# Patient Record
Sex: Female | Born: 1995 | Race: White | Hispanic: No | Marital: Single | Smoking: Never smoker
Health system: Southern US, Community
[De-identification: ages and names within clinical notes are randomized; demographics above are authoritative.]

## PROBLEM LIST (undated history)

## (undated) ENCOUNTER — Inpatient Hospital Stay (HOSPITAL_COMMUNITY): Payer: Self-pay

## (undated) DIAGNOSIS — Z789 Other specified health status: Secondary | ICD-10-CM

---
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
## 2016-09-11 LAB — OB RESULTS CONSOLE HEPATITIS B SURFACE ANTIGEN: HEP B S AG: NEGATIVE

## 2016-09-11 LAB — OB RESULTS CONSOLE RPR: RPR: NONREACTIVE

## 2016-09-11 LAB — OB RESULTS CONSOLE GC/CHLAMYDIA
CHLAMYDIA, DNA PROBE: NEGATIVE
Gonorrhea: NEGATIVE

## 2016-09-11 LAB — OB RESULTS CONSOLE RUBELLA ANTIBODY, IGM: RUBELLA: IMMUNE

## 2016-09-11 LAB — OB RESULTS CONSOLE GBS: GBS: NEGATIVE

## 2016-09-11 LAB — OB RESULTS CONSOLE HIV ANTIBODY (ROUTINE TESTING): HIV: NONREACTIVE

## 2016-11-21 ENCOUNTER — Inpatient Hospital Stay (HOSPITAL_COMMUNITY): Payer: 59

## 2016-11-21 ENCOUNTER — Inpatient Hospital Stay (HOSPITAL_COMMUNITY)
Admission: AD | Admit: 2016-11-21 | Discharge: 2016-11-21 | Disposition: A | Discharge status: Home / Self Care | Payer: 59 | Source: Ambulatory Visit | Attending: Obstetrics & Gynecology | Admitting: Obstetrics & Gynecology

## 2016-11-21 ENCOUNTER — Encounter (HOSPITAL_COMMUNITY): Payer: Self-pay | Admitting: *Deleted

## 2016-11-21 DIAGNOSIS — O4693 Antepartum hemorrhage, unspecified, third trimester: Secondary | ICD-10-CM | POA: Insufficient documentation

## 2016-11-21 DIAGNOSIS — O368130 Decreased fetal movements, third trimester, not applicable or unspecified: Secondary | ICD-10-CM | POA: Insufficient documentation

## 2016-11-21 DIAGNOSIS — O468X3 Other antepartum hemorrhage, third trimester: Secondary | ICD-10-CM

## 2016-11-21 DIAGNOSIS — Z3A31 31 weeks gestation of pregnancy: Secondary | ICD-10-CM | POA: Insufficient documentation

## 2016-11-21 DIAGNOSIS — N930 Postcoital and contact bleeding: Secondary | ICD-10-CM

## 2016-11-21 DIAGNOSIS — O133 Gestational [pregnancy-induced] hypertension without significant proteinuria, third trimester: Secondary | ICD-10-CM

## 2016-11-21 HISTORY — DX: Other specified health status: Z78.9

## 2016-11-21 LAB — CBC
HEMATOCRIT: 33.9 % — AB (ref 36.0–46.0)
HEMOGLOBIN: 11.7 g/dL — AB (ref 12.0–15.0)
MCH: 30.1 pg (ref 26.0–34.0)
MCHC: 34.5 g/dL (ref 30.0–36.0)
MCV: 87.1 fL (ref 78.0–100.0)
Platelets: 243 10*3/uL (ref 150–400)
RBC: 3.89 MIL/uL (ref 3.87–5.11)
RDW: 12.8 % (ref 11.5–15.5)
WBC: 10.7 10*3/uL — AB (ref 4.0–10.5)

## 2016-11-21 LAB — URINALYSIS, ROUTINE W REFLEX MICROSCOPIC
Bilirubin Urine: NEGATIVE
GLUCOSE, UA: NEGATIVE mg/dL
Ketones, ur: NEGATIVE mg/dL
Nitrite: NEGATIVE
PH: 6 (ref 5.0–8.0)
PROTEIN: NEGATIVE mg/dL
SPECIFIC GRAVITY, URINE: 1.003 — AB (ref 1.005–1.030)

## 2016-11-21 LAB — COMPREHENSIVE METABOLIC PANEL
ALBUMIN: 3 g/dL — AB (ref 3.5–5.0)
ALT: 55 U/L — AB (ref 14–54)
AST: 36 U/L (ref 15–41)
Alkaline Phosphatase: 59 U/L (ref 38–126)
Anion gap: 8 (ref 5–15)
BILIRUBIN TOTAL: 0.6 mg/dL (ref 0.3–1.2)
BUN: 7 mg/dL (ref 6–20)
CHLORIDE: 108 mmol/L (ref 101–111)
CO2: 20 mmol/L — ABNORMAL LOW (ref 22–32)
CREATININE: 0.54 mg/dL (ref 0.44–1.00)
Calcium: 8.9 mg/dL (ref 8.9–10.3)
GFR calc Af Amer: >60 mL/min (ref >60)
GFR calc non Af Amer: >60 mL/min (ref >60)
GLUCOSE: 88 mg/dL (ref 65–99)
POTASSIUM: 3.6 mmol/L (ref 3.5–5.1)
Sodium: 136 mmol/L (ref 135–145)
TOTAL PROTEIN: 6.7 g/dL (ref 6.5–8.1)

## 2016-11-21 LAB — PROTEIN / CREATININE RATIO, URINE
Creatinine, Urine: 27 mg/dL
Total Protein, Urine: <6 mg/dL

## 2016-11-21 IMAGING — US US MFM FETAL BPP W/O NON-STRESS
1 series · 10 of 10 positions shown · non-contrast
Comparison: none

[Series 1: us mfm fetal bpp w/o non-stress · 10 acquisitions, 10 frames shown]
[im 1/10]
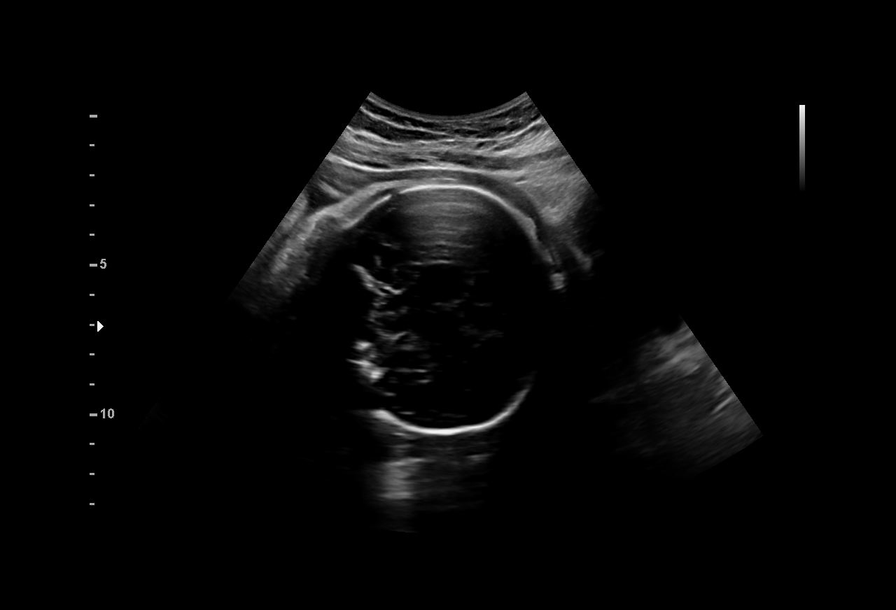
[im 2/10]
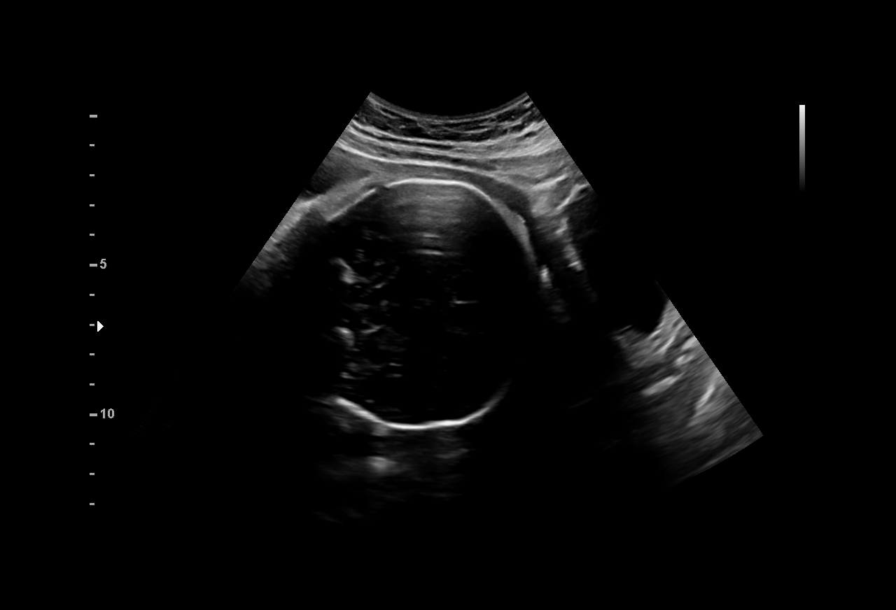
[im 3/10]
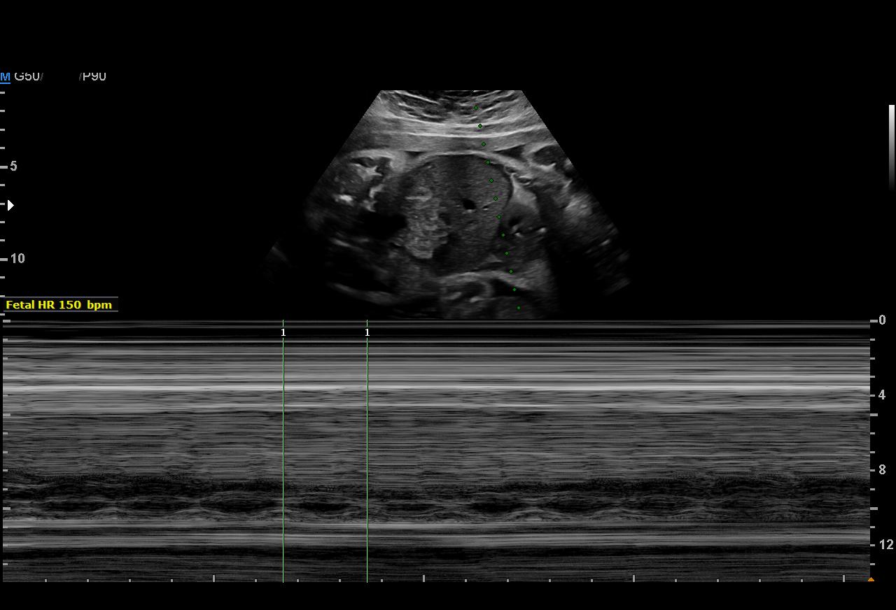
[im 4/10]
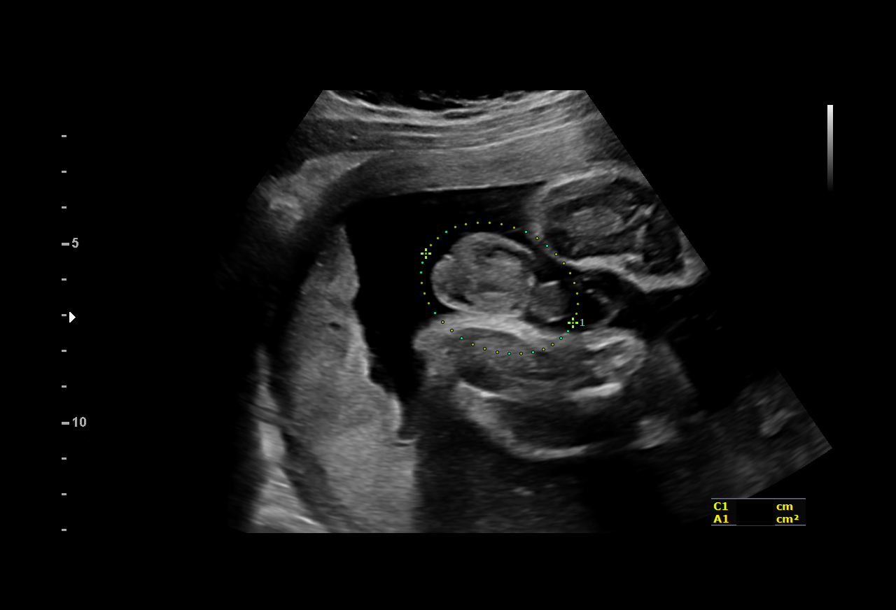
[im 5/10]
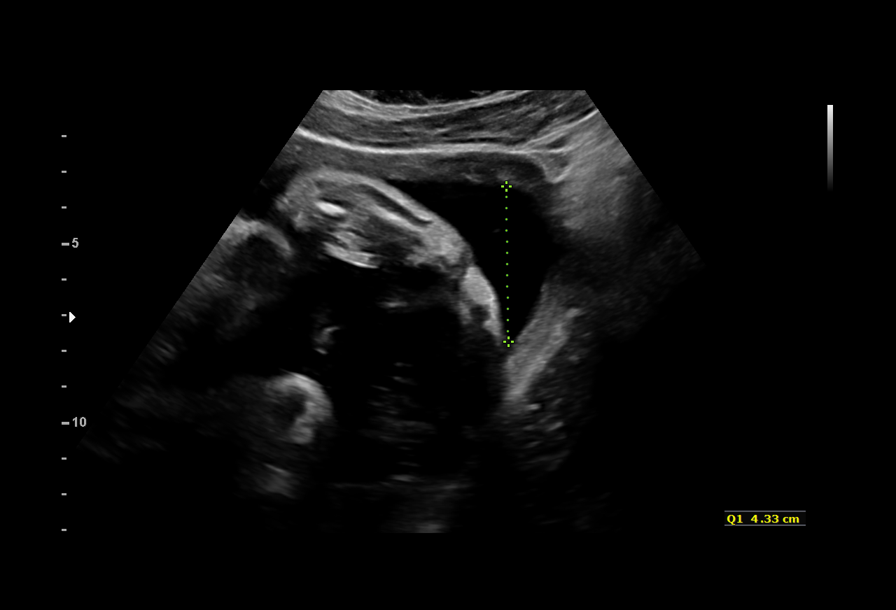
[im 6/10]
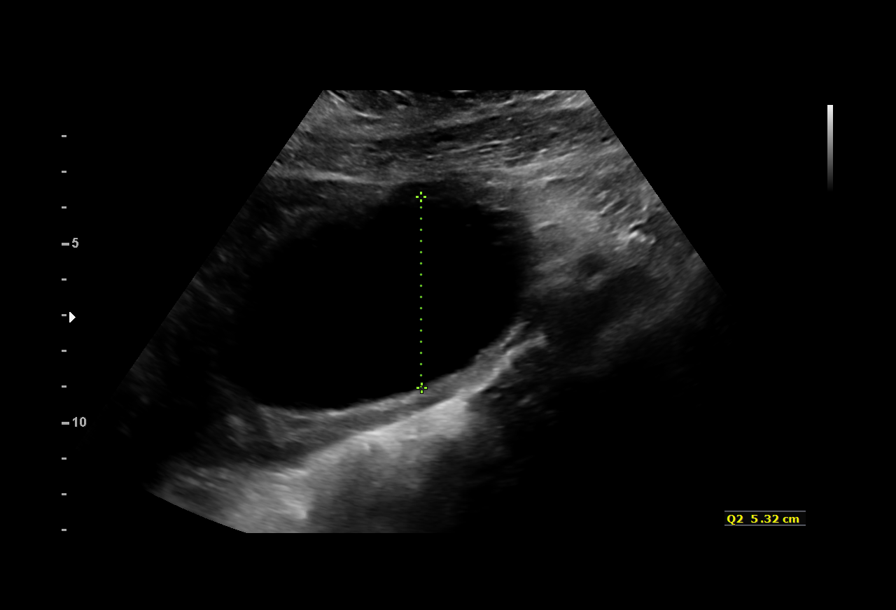
[im 7/10]
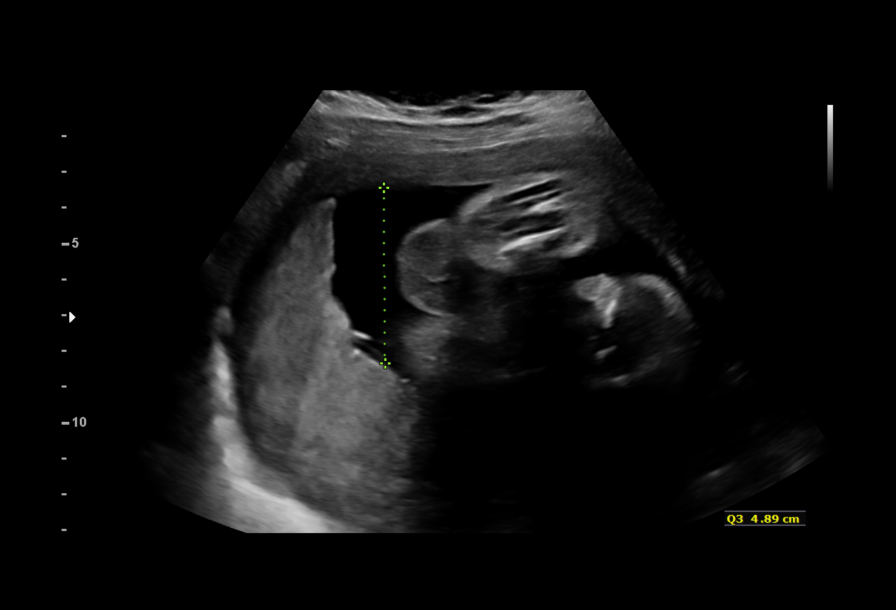
[im 8/10]
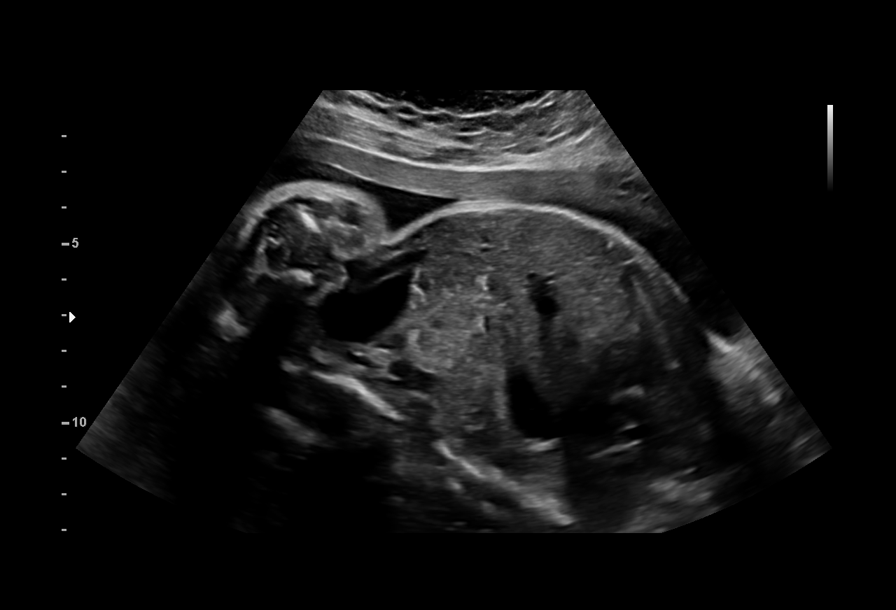
[im 9/10]
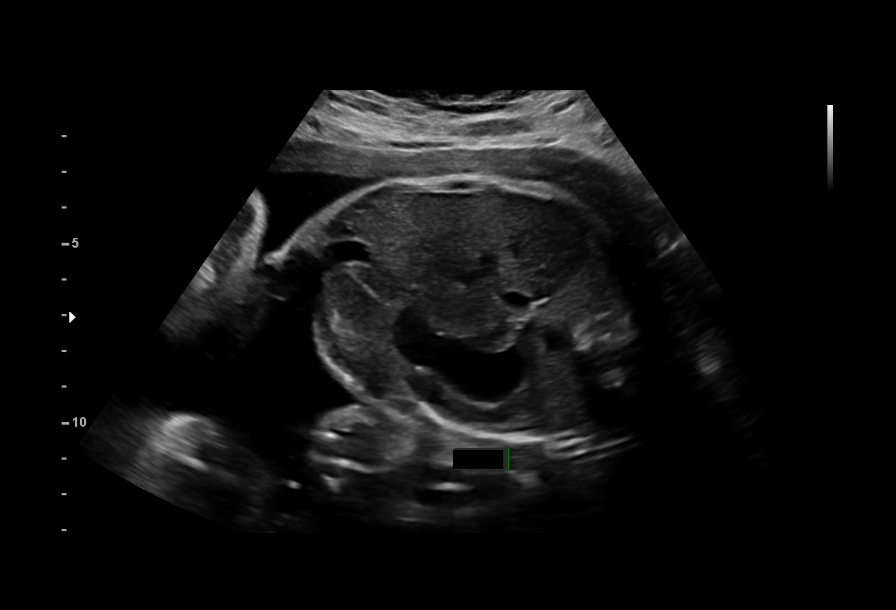
[im 10/10]
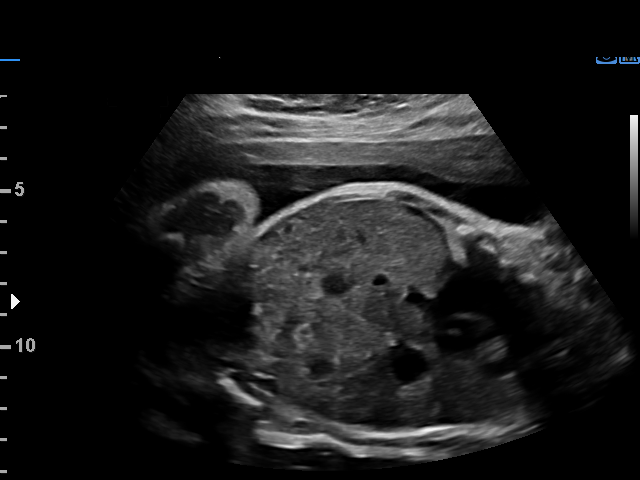

[10 of 10 positions shown; findings below may reference images not displayed]

Indications

31 weeks gestation of pregnancy
Decreased fetal movement                       [LZ]
Variable fetal heart rate decelerations,       [LZ]
antepartum
OB History

Gravidity:    1
Fetal Evaluation

Num Of Fetuses:     1
Fetal Heart         150
Rate(bpm):
Cardiac Activity:   Observed
Presentation:       Cephalic

Amniotic Fluid
AFI FV:      Subjectively within normal limits

AFI Sum(cm)     %Tile       Largest Pocket(cm)
14.54           51

RUQ(cm)                     LUQ(cm)        LLQ(cm)
4.33
Biophysical Evaluation

Amniotic F.V:   Within normal limits       F. Tone:        Observed
F. Movement:    Observed                   Score:          [DATE]
F. Breathing:   Observed
Gestational Age

Clinical EDD:  31w 1d                                        EDD:   [DATE]
Best:          31w 1d    Det. By:   Clinical EDD             EDD:   [DATE]
Anatomy

Stomach:               Appears normal, left   Bladder:                Appears normal
sided
Cervix Uterus Adnexa

Cervix
Not visualized (advanced GA >[LZ])
Impression

Singleton intrauterine pregnancy at 31 weeks 1 day gestation
with fetal cardiac activity
Cephalic presentation
AFI > 14 cm
Recommendations

Follow-up ultrasounds as clinically indicated.

## 2016-11-21 NOTE — Discharge Instructions (Signed)
Hypertension During Pregnancy °Hypertension, commonly called high blood pressure, is when the force of blood pumping through your arteries is too strong. Arteries are blood vessels that carry blood from the heart throughout the body. Hypertension during pregnancy can cause problems for you and your baby. Your baby may be born early (prematurely) or may not weigh as much as he or she should at birth. Very bad cases of hypertension during pregnancy can be life-threatening. °Different types of hypertension can occur during pregnancy. These include: °· Chronic hypertension. This happens when: °? You have hypertension before pregnancy and it continues during pregnancy. °? You develop hypertension before you are [redacted] weeks pregnant, and it continues during pregnancy. °· Gestational hypertension. This is hypertension that develops after the 20th week of pregnancy. °· Preeclampsia, also called toxemia of pregnancy. This is a very serious type of hypertension that develops only during pregnancy. It affects the whole body, and it can be very dangerous for you and your baby. ° °Gestational hypertension and preeclampsia usually go away within 6 weeks after your baby is born. Women who have hypertension during pregnancy have a greater chance of developing hypertension later in life or during future pregnancies. °What are the causes? °The exact cause of hypertension is not known. °What increases the risk? °There are certain factors that make it more likely for you to develop hypertension during pregnancy. These include: °· Having hypertension during a previous pregnancy or prior to pregnancy. °· Being overweight. °· Being older than age 40. °· Being pregnant for the first time or being pregnant with more than one baby. °· Becoming pregnant using fertilization methods such as IVF (in vitro fertilization). °· Having diabetes, kidney problems, or systemic lupus erythematosus. °· Having a family history of hypertension. ° °What are the  signs or symptoms? °Chronic hypertension and gestational hypertension rarely cause symptoms. Preeclampsia causes symptoms, which may include: °· Increased protein in your urine. Your health care provider will check for this at every visit before you give birth (prenatal visit). °· Severe headaches. °· Sudden weight gain. °· Swelling of the hands, face, legs, and feet. °· Nausea and vomiting. °· Vision problems, such as blurred or double vision. °· Numbness in the face, arms, legs, and feet. °· Dizziness. °· Slurred speech. °· Sensitivity to bright lights. °· Abdominal pain. °· Convulsions. ° °How is this diagnosed? °You may be diagnosed with hypertension during a routine prenatal exam. At each prenatal visit, you may: °· Have a urine test to check for high amounts of protein in your urine. °· Have your blood pressure checked. A blood pressure reading is recorded as two numbers, such as "120 over 80" (or 120/80). The first ("top") number is called the systolic pressure. It is a measure of the pressure in your arteries when your heart beats. The second ("bottom") number is called the diastolic pressure. It is a measure of the pressure in your arteries as your heart relaxes between beats. Blood pressure is measured in a unit called mm Hg. A normal blood pressure reading is: °? Systolic: below 120. °? Diastolic: below 80. ° °The type of hypertension that you are diagnosed with depends on your test results and when your symptoms developed. °· Chronic hypertension is usually diagnosed before 20 weeks of pregnancy. °· Gestational hypertension is usually diagnosed after 20 weeks of pregnancy. °· Hypertension with high amounts of protein in the urine is diagnosed as preeclampsia. °· Blood pressure measurements that stay above 160 systolic, or above 110 diastolic, are   signs of severe preeclampsia. ° °How is this treated? °Treatment for hypertension during pregnancy varies depending on the type of hypertension you have and how  serious it is. °· If you take medicines called ACE inhibitors to treat chronic hypertension, you may need to switch medicines. ACE inhibitors should not be taken during pregnancy. °· If you have gestational hypertension, you may need to take blood pressure medicine. °· If you are at risk for preeclampsia, your health care provider may recommend that you take a low-dose aspirin every day to prevent high blood pressure during your pregnancy. °· If you have severe preeclampsia, you may need to be hospitalized so you and your baby can be monitored closely. You may also need to take medicine (magnesium sulfate) to prevent seizures and to lower blood pressure. This medicine may be given as an injection or through an IV tube. °· In some cases, if your condition gets worse, you may need to deliver your baby early. ° °Follow these instructions at home: °Eating and drinking °· Drink enough fluid to keep your urine clear or pale yellow. °· Eat a healthy diet that is low in salt (sodium). Do not add salt to your food. Check food labels to see how much sodium a food or beverage contains. °Lifestyle °· Do not use any products that contain nicotine or tobacco, such as cigarettes and e-cigarettes. If you need help quitting, ask your health care provider. °· Do not use alcohol. °· Avoid caffeine. °· Avoid stress as much as possible. Rest and get plenty of sleep. °General instructions °· Take over-the-counter and prescription medicines only as told by your health care provider. °· While lying down, lie on your left side. This keeps pressure off your baby. °· While sitting or lying down, raise (elevate) your feet. Try putting some pillows under your lower legs. °· Exercise regularly. Ask your health care provider what kinds of exercise are best for you. °· Keep all prenatal and follow-up visits as told by your health care provider. This is important. °Contact a health care provider if: °· You have symptoms that your health care  provider told you may require more treatment or monitoring, such as: °? Fever. °? Vomiting. °? Headache. °Get help right away if: °· You have severe abdominal pain or vomiting that does not get better with treatment. °· You suddenly develop swelling in your hands, ankles, or face. °· You gain 4 lbs (1.8 kg) or more in 1 week. °· You develop vaginal bleeding, or you have blood in your urine. °· You do not feel your baby moving as much as usual. °· You have blurred or double vision. °· You have muscle twitching or sudden tightening (spasms). °· You have shortness of breath. °· Your lips or fingernails turn blue. °This information is not intended to replace advice given to you by your health care provider. Make sure you discuss any questions you have with your health care provider. °Document Released: 10/30/2010 Document Revised: 09/01/2015 Document Reviewed: 07/28/2015 °Elsevier Interactive Patient Education © 2018 [employer] ° °

## 2016-11-21 NOTE — MAU Provider Note (Signed)
History     CSN: [id number]  Arrival date and time: 11/21/16 1709  First Provider Initiated Contact with Patient 11/21/16 1826      Chief Complaint  Patient presents with  . Vaginal Bleeding  . Decreased Fetal Movement   HPI Mary Costa is a 21 y.o. G1P0 at [redacted]w[redacted]d who presents for vaginal bleeding & decreased fetal movement. Patient reports pink spotting on toilet paper & in underwear today. Has not had to wear pad & no passing clots. Had intercourse last night. Denies abdominal pain, recent fall/abdominal trauma. Denies issues with placenta during this pregnancy. Decreased fetal movement. Reports feeling baby move once since lunch time today.  Upon arrival in MAU, pt has elevated BP. Denies any history of hypertension. Denies headache, visual changes, or epigastric pain.  OB History    Gravida Para Term Preterm AB Living   1             SAB TAB Ectopic Multiple Live Births                  Past Medical History:  Diagnosis Date  . Medical history non-contributory     Past Surgical History:  Procedure Laterality Date  . NO PAST SURGERIES      History reviewed. No pertinent family history.  Social History  Substance Use Topics  . Smoking status: Never Smoker  . Smokeless tobacco: Never Used  . Alcohol use No    Allergies: No Known Allergies  No prescriptions prior to admission.    Review of Systems  Eyes: Negative for visual disturbance.  Gastrointestinal: Negative.   Genitourinary: Positive for vaginal bleeding. Negative for dysuria and vaginal discharge.  Neurological: Negative for headaches.   Physical Exam   Blood pressure 128/71, pulse 78, temperature 98.1 F (36.7 C), temperature source Oral, resp. rate 19, height 5' 2.75" (1.594 m), weight 221 lb (100.2 kg), SpO2 100 %. Patient Vitals for the past 24 hrs:  BP Temp Temp src Pulse Resp SpO2 Height Weight  11/21/16 2015 (!) 151/86 - - 80 - - - -  11/21/16 2009 (!) 148/105 - - 92 - - - -  11/21/16  1930 (!) 141/82 - - 86 - - - -  11/21/16 1915 (!) 142/83 - - 80 - - - -  11/21/16 1900 130/78 - - 80 - - - -  11/21/16 1846 128/71 - - 78 - - - -  11/21/16 1830 137/84 - - 88 - - - -  11/21/16 1816 138/80 - - 81 - - - -  11/21/16 1803 (!) 159/95 - - 96 - - - -  11/21/16 1801 (!) 148/80 - - - - - - -  11/21/16 1745 (!) 147/108 98.1 F (36.7 C) Oral 93 19 100 % 5' 2.75" (1.594 m) 221 lb (100.2 kg)    Physical Exam  Nursing note and vitals reviewed. Constitutional: She is oriented to person, place, and time. She appears well-developed and well-nourished. No distress.  HENT:  Head: Normocephalic and atraumatic.  Eyes: Conjunctivae are normal. Right eye exhibits no discharge. Left eye exhibits no discharge. No scleral icterus.  Neck: Normal range of motion.  Cardiovascular: Normal rate, regular rhythm and normal heart sounds.   No murmur heard. Respiratory: Effort normal and breath sounds normal. No respiratory distress. She has no wheezes.  GI: Soft. There is no tenderness.  Genitourinary: No bleeding in the vagina. Vaginal discharge (small amount of creamy white discharge) found.  Genitourinary Comments: No blood.  Cervix visually closed. Cervix smooth & pink; no friability  Neurological: She is alert and oriented to person, place, and time.  Skin: Skin is warm and dry. She is not diaphoretic.  Psychiatric: She has a normal mood and affect. Her behavior is normal. Judgment and thought content normal.   Fetal Tracing:  Baseline: 150 Variability: moderate Accelerations: 10x10 Decelerations: none  Toco: none MAU Course  Procedures Results for orders placed or performed during the hospital encounter of 11/21/16 (from the past 24 hour(s))  Urinalysis, Routine w reflex microscopic     Status: Abnormal   Collection Time: 11/21/16  5:47 PM  Result Value Ref Range   Color, Urine STRAW (A) YELLOW   APPearance CLEAR CLEAR   Specific Gravity, Urine 1.003 (L) 1.005 - 1.030   pH 6.0 5.0 -  8.0   Glucose, UA NEGATIVE NEGATIVE mg/dL   Hgb urine dipstick SMALL (A) NEGATIVE   Bilirubin Urine NEGATIVE NEGATIVE   Ketones, ur NEGATIVE NEGATIVE mg/dL   Protein, ur NEGATIVE NEGATIVE mg/dL   Nitrite NEGATIVE NEGATIVE   Leukocytes, UA TRACE (A) NEGATIVE   RBC / HPF 0-5 0 - 5 RBC/hpf   WBC, UA 0-5 0 - 5 WBC/hpf   Bacteria, UA RARE (A) NONE SEEN   Squamous Epithelial / LPF 0-5 (A) NONE SEEN  Protein / creatinine ratio, urine     Status: None   Collection Time: 11/21/16  5:47 PM  Result Value Ref Range   Creatinine, Urine 27.00 mg/dL   Total Protein, Urine <6 mg/dL   Protein Creatinine Ratio        0.00 - 0.15 mg/mg[Cre]  CBC     Status: Abnormal   Collection Time: 11/21/16  6:51 PM  Result Value Ref Range   WBC 10.7 (H) 4.0 - 10.5 K/uL   RBC 3.89 3.87 - 5.11 MIL/uL   Hemoglobin 11.7 (L) 12.0 - 15.0 g/dL   HCT 16.1 (L) 09.6 - 04.5 %   MCV 87.1 78.0 - 100.0 fL   MCH 30.1 26.0 - 34.0 pg   MCHC 34.5 30.0 - 36.0 g/dL   RDW 40.9 81.1 - 91.4 %   Platelets 243 150 - 400 K/uL  Comprehensive metabolic panel     Status: Abnormal   Collection Time: 11/21/16  6:51 PM  Result Value Ref Range   Sodium 136 135 - 145 mmol/L   Potassium 3.6 3.5 - 5.1 mmol/L   Chloride 108 101 - 111 mmol/L   CO2 20 (L) 22 - 32 mmol/L   Glucose, Bld 88 65 - 99 mg/dL   BUN 7 6 - 20 mg/dL   Creatinine, Ser 7.82 0.44 - 1.00 mg/dL   Calcium 8.9 8.9 - 95.6 mg/dL   Total Protein 6.7 6.5 - 8.1 g/dL   Albumin 3.0 (L) 3.5 - 5.0 g/dL   AST 36 15 - 41 U/L   ALT 55 (H) 14 - 54 U/L   Alkaline Phosphatase 59 38 - 126 U/L   Total Bilirubin 0.6 0.3 - 1.2 mg/dL   GFR calc non Af Amer >60 >60 mL/min   GFR calc Af Amer >60 >60 mL/min   Anion gap 8 5 - 15    MDM B positive per prenatal record Reactive NST Elevated BPs, none severe range. CBC, CMP, urine PCR pending ?Decel vs MHR while intermittent tracing, sent for BPP -- BPP 8/8 with normal AFI C/w Dr. Langston Masker. Discussed VS, exam, labs, FHT, & ultrasound. Will  have pt f/u for BP eval  in office on Monday.  Per patient she has appt in office tomorrow.  Assessment and Plan  A; 1. Postcoital bleeding   2. [redacted] weeks gestation of pregnancy   3. Decreased fetal movement affecting management of pregnancy in third trimester, single or unspecified fetus   4. Pregnancy-induced hypertension in third trimester    P: Discharge home Discussed reasons to return to MAU Keep f/u with OB tomorrow  Judeth Horn 11/21/2016, 6:26 PM

## 2016-11-21 NOTE — MAU Note (Signed)
Pt report earlier when she went to the restroom this pm there was some blood on the tissue. Last intercourse was last pm. Decreased fetal movement

## 2016-12-16 ENCOUNTER — Encounter: Payer: Self-pay | Admitting: Pediatrics

## 2016-12-16 ENCOUNTER — Ambulatory Visit (INDEPENDENT_AMBULATORY_CARE_PROVIDER_SITE_OTHER): Payer: Self-pay | Admitting: Pediatrics

## 2016-12-16 DIAGNOSIS — Z7681 Expectant parent(s) prebirth pediatrician visit: Secondary | ICD-10-CM

## 2016-12-16 NOTE — Progress Notes (Signed)
Prenatal counseling for impending newborn done-- Z76.81  

## 2017-01-07 ENCOUNTER — Encounter (HOSPITAL_COMMUNITY): Payer: Self-pay | Admitting: Certified Registered Nurse Anesthetist

## 2017-01-07 ENCOUNTER — Encounter (HOSPITAL_COMMUNITY): Payer: Self-pay | Admitting: *Deleted

## 2017-01-07 ENCOUNTER — Inpatient Hospital Stay (HOSPITAL_COMMUNITY)
Admission: AD | Admit: 2017-01-07 | Discharge: 2017-01-11 | DRG: 788 | Disposition: A | Discharge status: Home / Self Care | Payer: BC Managed Care – PPO | Source: Ambulatory Visit | Attending: Obstetrics & Gynecology | Admitting: Obstetrics & Gynecology

## 2017-01-07 DIAGNOSIS — O134 Gestational [pregnancy-induced] hypertension without significant proteinuria, complicating childbirth: Secondary | ICD-10-CM | POA: Diagnosis present

## 2017-01-07 DIAGNOSIS — Z98891 History of uterine scar from previous surgery: Secondary | ICD-10-CM

## 2017-01-07 DIAGNOSIS — Z3A38 38 weeks gestation of pregnancy: Secondary | ICD-10-CM | POA: Diagnosis not present

## 2017-01-07 DIAGNOSIS — O99214 Obesity complicating childbirth: Secondary | ICD-10-CM | POA: Diagnosis present

## 2017-01-07 LAB — TYPE AND SCREEN
ABO/RH(D): B POS
Antibody Screen: NEGATIVE

## 2017-01-07 LAB — CBC
HEMATOCRIT: 37.3 % (ref 36.0–46.0)
HEMOGLOBIN: 12.5 g/dL (ref 12.0–15.0)
MCH: 29.1 pg (ref 26.0–34.0)
MCHC: 33.5 g/dL (ref 30.0–36.0)
MCV: 86.7 fL (ref 78.0–100.0)
Platelets: 261 10*3/uL (ref 150–400)
RBC: 4.3 MIL/uL (ref 3.87–5.11)
RDW: 13.7 % (ref 11.5–15.5)
WBC: 12.5 10*3/uL — ABNORMAL HIGH (ref 4.0–10.5)

## 2017-01-07 LAB — ABO/RH: ABO/RH(D): B POS

## 2017-01-07 MED ORDER — MAGNESIUM SULFATE BOLUS VIA INFUSION
4.0000 g | Freq: Once | INTRAVENOUS | Status: AC
  Administered 2017-01-07: 4 g via INTRAVENOUS
  Filled 2017-01-07: qty 500

## 2017-01-07 MED ORDER — HYDRALAZINE HCL 20 MG/ML IJ SOLN
10.0000 mg | Freq: Once | INTRAMUSCULAR | Status: AC | PRN
  Administered 2017-01-08: 10 mg via INTRAVENOUS
  Filled 2017-01-07: qty 1

## 2017-01-07 MED ORDER — LACTATED RINGERS IV SOLN
500.0000 mL | INTRAVENOUS | Status: DC | PRN
  Administered 2017-01-08: 300 mL via INTRAVENOUS

## 2017-01-07 MED ORDER — MISOPROSTOL 25 MCG QUARTER TABLET
25.0000 ug | ORAL_TABLET | ORAL | Status: DC | PRN
  Administered 2017-01-07 – 2017-01-08 (×4): 25 ug via VAGINAL
  Filled 2017-01-07 (×4): qty 1

## 2017-01-07 MED ORDER — MAGNESIUM SULFATE 40 G IN LACTATED RINGERS - SIMPLE
2.0000 g/h | INTRAVENOUS | Status: DC
Start: 2017-01-07 — End: 2017-01-09
  Administered 2017-01-09: 2 g/h via INTRAVENOUS
  Filled 2017-01-07: qty 40
  Filled 2017-01-07 (×2): qty 500

## 2017-01-07 MED ORDER — OXYTOCIN BOLUS FROM INFUSION: 500.0000 mL | Freq: Once | INTRAVENOUS | Status: DC

## 2017-01-07 MED ORDER — ONDANSETRON HCL 4 MG/2ML IJ SOLN: 4.0000 mg | Freq: Four times a day (QID) | INTRAMUSCULAR | Status: DC | PRN

## 2017-01-07 MED ORDER — LABETALOL HCL 5 MG/ML IV SOLN
20.0000 mg | INTRAVENOUS | Status: AC | PRN
  Administered 2017-01-07: 20 mg via INTRAVENOUS
  Administered 2017-01-07: 40 mg via INTRAVENOUS
  Administered 2017-01-08: 20 mg via INTRAVENOUS
  Filled 2017-01-07: qty 4
  Filled 2017-01-07: qty 8
  Filled 2017-01-07: qty 4

## 2017-01-07 MED ORDER — ZOLPIDEM TARTRATE 5 MG PO TABS
5.0000 mg | ORAL_TABLET | Freq: Every evening | ORAL | Status: DC | PRN
  Administered 2017-01-07: 5 mg via ORAL
  Filled 2017-01-07: qty 1

## 2017-01-07 MED ORDER — OXYCODONE-ACETAMINOPHEN 5-325 MG PO TABS: 2.0000 | ORAL_TABLET | ORAL | Status: DC | PRN

## 2017-01-07 MED ORDER — OXYTOCIN 40 UNITS IN LACTATED RINGERS INFUSION - SIMPLE MED
2.5000 [IU]/h | INTRAVENOUS | Status: DC
  Filled 2017-01-07 (×2): qty 1000

## 2017-01-07 MED ORDER — OXYCODONE-ACETAMINOPHEN 5-325 MG PO TABS: 1.0000 | ORAL_TABLET | ORAL | Status: DC | PRN

## 2017-01-07 MED ORDER — TERBUTALINE SULFATE 1 MG/ML IJ SOLN: 0.2500 mg | Freq: Once | INTRAMUSCULAR | Status: DC | PRN

## 2017-01-07 MED ORDER — SOD CITRATE-CITRIC ACID 500-334 MG/5ML PO SOLN
30.0000 mL | ORAL | Status: DC | PRN
  Administered 2017-01-09: 30 mL via ORAL
  Filled 2017-01-07: qty 15

## 2017-01-07 MED ORDER — LIDOCAINE HCL (PF) 1 % IJ SOLN: 30.0000 mL | INTRAMUSCULAR | Status: DC | PRN

## 2017-01-07 MED ORDER — LACTATED RINGERS IV SOLN
INTRAVENOUS | Status: DC
  Administered 2017-01-07 – 2017-01-09 (×3): via INTRAVENOUS

## 2017-01-07 MED ORDER — ACETAMINOPHEN 325 MG PO TABS
650.0000 mg | ORAL_TABLET | ORAL | Status: DC | PRN
  Administered 2017-01-07: 650 mg via ORAL
  Filled 2017-01-07: qty 2

## 2017-01-07 NOTE — Anesthesia Pain Management Evaluation Note (Signed)
  CRNA Pain Management Visit Note  Patient: Mary Costa, 21 y.o., female  "Hello I am a member of the anesthesia team at [facility]. We have an anesthesia team available at all times to provide care throughout the hospital, including epidural management and anesthesia for C-section. I don't know your plan for the delivery whether it a natural birth, water birth, IV sedation, nitrous supplementation, doula or epidural, but we want to meet your pain goals."   1.Was your pain managed to your expectations on prior hospitalizations?   No prior hospitalizations  2.What is your expectation for pain management during this hospitalization?     Epidural  3.How can we help you reach that goal?   Record the patient's initial score and the patient's pain goal.   Pain: 0  Pain Goal: 6 [facility] wants you to be able to say your pain was always managed very well.  Mary Costa,Mary Costa 01/07/2017

## 2017-01-08 ENCOUNTER — Encounter (HOSPITAL_COMMUNITY): Payer: Self-pay | Admitting: *Deleted

## 2017-01-08 LAB — COMPREHENSIVE METABOLIC PANEL
ALT: 30 U/L (ref 14–54)
ANION GAP: 10 (ref 5–15)
AST: 30 U/L (ref 15–41)
Albumin: 3.1 g/dL — ABNORMAL LOW (ref 3.5–5.0)
Alkaline Phosphatase: 152 U/L — ABNORMAL HIGH (ref 38–126)
BUN: 9 mg/dL (ref 6–20)
CO2: 18 mmol/L — ABNORMAL LOW (ref 22–32)
CREATININE: 0.68 mg/dL (ref 0.44–1.00)
Calcium: 7.6 mg/dL — ABNORMAL LOW (ref 8.9–10.3)
Chloride: 103 mmol/L (ref 101–111)
Glucose, Bld: 80 mg/dL (ref 65–99)
POTASSIUM: 3.8 mmol/L (ref 3.5–5.1)
Sodium: 131 mmol/L — ABNORMAL LOW (ref 135–145)
Total Bilirubin: 0.6 mg/dL (ref 0.3–1.2)
Total Protein: 6.5 g/dL (ref 6.5–8.1)

## 2017-01-08 LAB — CBC
HEMATOCRIT: 35.1 % — AB (ref 36.0–46.0)
Hemoglobin: 11.9 g/dL — ABNORMAL LOW (ref 12.0–15.0)
MCH: 29 pg (ref 26.0–34.0)
MCHC: 33.9 g/dL (ref 30.0–36.0)
MCV: 85.6 fL (ref 78.0–100.0)
PLATELETS: 223 10*3/uL (ref 150–400)
RBC: 4.1 MIL/uL (ref 3.87–5.11)
RDW: 13.8 % (ref 11.5–15.5)
WBC: 14.2 10*3/uL — AB (ref 4.0–10.5)

## 2017-01-08 LAB — RPR: RPR Ser Ql: NONREACTIVE

## 2017-01-08 MED ORDER — DIPHENHYDRAMINE HCL 50 MG/ML IJ SOLN: 12.5000 mg | INTRAMUSCULAR | Status: DC | PRN

## 2017-01-08 MED ORDER — PHENYLEPHRINE 40 MCG/ML (10ML) SYRINGE FOR IV PUSH (FOR BLOOD PRESSURE SUPPORT)
80.0000 ug | PREFILLED_SYRINGE | INTRAVENOUS | Status: DC | PRN
  Filled 2017-01-08: qty 10

## 2017-01-08 MED ORDER — TERBUTALINE SULFATE 1 MG/ML IJ SOLN: 0.2500 mg | Freq: Once | INTRAMUSCULAR | Status: DC | PRN

## 2017-01-08 MED ORDER — HYDRALAZINE HCL 20 MG/ML IJ SOLN
10.0000 mg | Freq: Once | INTRAMUSCULAR | Status: AC | PRN
  Administered 2017-01-08: 10 mg via INTRAVENOUS

## 2017-01-08 MED ORDER — OXYTOCIN 40 UNITS IN LACTATED RINGERS INFUSION - SIMPLE MED
1.0000 m[IU]/min | INTRAVENOUS | Status: DC
  Administered 2017-01-08 – 2017-01-09 (×2): 2 m[IU]/min via INTRAVENOUS

## 2017-01-08 MED ORDER — FENTANYL 2.5 MCG/ML BUPIVACAINE 1/10 % EPIDURAL INFUSION (WH - ANES)
14.0000 mL/h | INTRAMUSCULAR | Status: DC | PRN
  Administered 2017-01-09: 14 mL/h via EPIDURAL
  Filled 2017-01-08: qty 100

## 2017-01-08 MED ORDER — EPHEDRINE 5 MG/ML INJ: 10.0000 mg | INTRAVENOUS | Status: DC | PRN

## 2017-01-08 MED ORDER — BUTORPHANOL TARTRATE 1 MG/ML IJ SOLN
1.0000 mg | INTRAMUSCULAR | Status: DC | PRN
  Administered 2017-01-08: 1 mg via INTRAVENOUS
  Filled 2017-01-08: qty 1

## 2017-01-08 MED ORDER — PHENYLEPHRINE 40 MCG/ML (10ML) SYRINGE FOR IV PUSH (FOR BLOOD PRESSURE SUPPORT)
80.0000 ug | PREFILLED_SYRINGE | INTRAVENOUS | Status: AC | PRN
  Administered 2017-01-09 (×3): 80 ug via INTRAVENOUS

## 2017-01-08 MED ORDER — LABETALOL HCL 5 MG/ML IV SOLN
20.0000 mg | INTRAVENOUS | Status: DC | PRN
  Administered 2017-01-08: 20 mg via INTRAVENOUS
  Filled 2017-01-08: qty 8

## 2017-01-08 MED ORDER — LACTATED RINGERS IV SOLN: 500.0000 mL | Freq: Once | INTRAVENOUS | Status: DC

## 2017-01-08 NOTE — Progress Notes (Signed)
Patient ID: Mary KellJessica M Costa, female   DOB: 10/28/1995, 21 y.o.   MRN: [id number] Pt without HA, [place] or RUQ pain BPS elevated and requiring prn labetalol for control 156/96 last BP FHR 130s Cat 1 Ctxs occas  Cx 1/th/long posterior  DTRs 1/4 2+ edema  Gest HTN - recheck labs Cont IOL.  Unfavorable cx.  Now on 4th cytotec Cont magnesium and Labetalol prn DL

## 2017-01-08 NOTE — Progress Notes (Signed)
Patient ID: Mary KellJessica M Costa, female   DOB: 02/10/1996, 21 y.o.   MRN: [id number] Pt reports ctxs getting stronger No severe features of Gest HTN  VS 133/95-176/100  FHR 140s Cat 1 Ctxs q 3-5 Pit at 12 miu/min  Cx 1/20/posterior DTRS 2/4 Labs today WNL  Gest HTN- cont IOL (S/P cytotec x 4 and pitocin) Pt is stable and FHR reassuring.  Pt wants to Cont IOL.(offered Prim C/S)  DL

## 2017-01-09 ENCOUNTER — Other Ambulatory Visit: Payer: Self-pay

## 2017-01-09 ENCOUNTER — Inpatient Hospital Stay (HOSPITAL_COMMUNITY): Payer: BC Managed Care – PPO | Admitting: Anesthesiology

## 2017-01-09 ENCOUNTER — Encounter (HOSPITAL_COMMUNITY)
Admission: AD | Disposition: A | Discharge status: Home / Self Care | Payer: Self-pay | Source: Ambulatory Visit | Attending: Obstetrics & Gynecology

## 2017-01-09 DIAGNOSIS — Z98891 History of uterine scar from previous surgery: Secondary | ICD-10-CM

## 2017-01-09 LAB — CBC
HCT: 37.2 % (ref 36.0–46.0)
HCT: 29.6 % — ABNORMAL LOW (ref 36.0–46.0)
HEMOGLOBIN: 12.2 g/dL (ref 12.0–15.0)
HEMOGLOBIN: 9.8 g/dL — AB (ref 12.0–15.0)
MCH: 29 pg (ref 26.0–34.0)
MCH: 29.6 pg (ref 26.0–34.0)
MCHC: 32.8 g/dL (ref 30.0–36.0)
MCHC: 33.1 g/dL (ref 30.0–36.0)
MCV: 88.6 fL (ref 78.0–100.0)
MCV: 89.4 fL (ref 78.0–100.0)
PLATELETS: 257 10*3/uL (ref 150–400)
PLATELETS: 299 10*3/uL (ref 150–400)
RBC: 4.2 MIL/uL (ref 3.87–5.11)
RBC: 3.31 MIL/uL — ABNORMAL LOW (ref 3.87–5.11)
RDW: 14 % (ref 11.5–15.5)
RDW: 14.1 % (ref 11.5–15.5)
WBC: 12.8 10*3/uL — ABNORMAL HIGH (ref 4.0–10.5)
WBC: 20.4 10*3/uL — AB (ref 4.0–10.5)

## 2017-01-09 SURGERY — Surgical Case
Anesthesia: Epidural

## 2017-01-09 MED ORDER — MISOPROSTOL 50MCG HALF TABLET
50.0000 ug | ORAL_TABLET | ORAL | Status: DC
  Administered 2017-01-09: 50 ug via ORAL
  Filled 2017-01-09 (×2): qty 1

## 2017-01-09 MED ORDER — COCONUT OIL OIL
1.0000 "application " | TOPICAL_OIL | Status: DC | PRN
  Administered 2017-01-11: 1 via TOPICAL
  Filled 2017-01-09: qty 120

## 2017-01-09 MED ORDER — ONDANSETRON HCL 4 MG/2ML IJ SOLN
INTRAMUSCULAR | Status: AC
  Filled 2017-01-09: qty 2

## 2017-01-09 MED ORDER — LACTATED RINGERS IV SOLN
INTRAVENOUS | Status: DC | PRN
  Administered 2017-01-09: 10:00:00 via INTRAVENOUS

## 2017-01-09 MED ORDER — FENTANYL CITRATE (PF) 100 MCG/2ML IJ SOLN
INTRAMUSCULAR | Status: DC | PRN
  Administered 2017-01-09: 100 ug via EPIDURAL
  Administered 2017-01-09: 250 ug via INTRAVENOUS

## 2017-01-09 MED ORDER — SIMETHICONE 80 MG PO CHEW
80.0000 mg | CHEWABLE_TABLET | ORAL | Status: DC
  Administered 2017-01-09 – 2017-01-11 (×2): 80 mg via ORAL
  Filled 2017-01-09 (×2): qty 1

## 2017-01-09 MED ORDER — MIDAZOLAM HCL 2 MG/2ML IJ SOLN
INTRAMUSCULAR | Status: DC | PRN
  Administered 2017-01-09: 2 mg via INTRAVENOUS

## 2017-01-09 MED ORDER — OXYTOCIN 10 UNIT/ML IJ SOLN
INTRAVENOUS | Status: DC | PRN
  Administered 2017-01-09: 40 [IU] via INTRAVENOUS

## 2017-01-09 MED ORDER — LACTATED RINGERS IV SOLN
INTRAVENOUS | Status: DC
  Administered 2017-01-09 – 2017-01-10 (×2): via INTRAVENOUS

## 2017-01-09 MED ORDER — MORPHINE SULFATE (PF) 0.5 MG/ML IJ SOLN
INTRAMUSCULAR | Status: AC
  Filled 2017-01-09: qty 10

## 2017-01-09 MED ORDER — MAGNESIUM SULFATE BOLUS VIA INFUSION
2.0000 g | Freq: Once | INTRAVENOUS | Status: DC
  Filled 2017-01-09: qty 500

## 2017-01-09 MED ORDER — OXYCODONE-ACETAMINOPHEN 5-325 MG PO TABS: 2.0000 | ORAL_TABLET | ORAL | Status: DC | PRN

## 2017-01-09 MED ORDER — LIDOCAINE HCL (PF) 1 % IJ SOLN
INTRAMUSCULAR | Status: DC | PRN
  Administered 2017-01-09 (×2): 4 mL via EPIDURAL

## 2017-01-09 MED ORDER — DEXAMETHASONE SODIUM PHOSPHATE 10 MG/ML IJ SOLN
INTRAMUSCULAR | Status: AC
  Filled 2017-01-09: qty 1

## 2017-01-09 MED ORDER — SIMETHICONE 80 MG PO CHEW: 80.0000 mg | CHEWABLE_TABLET | ORAL | Status: DC | PRN

## 2017-01-09 MED ORDER — SODIUM CHLORIDE 0.9 % IR SOLN
Status: DC | PRN
  Administered 2017-01-09: 1

## 2017-01-09 MED ORDER — MIDAZOLAM HCL 2 MG/2ML IJ SOLN
INTRAMUSCULAR | Status: AC
  Filled 2017-01-09: qty 2

## 2017-01-09 MED ORDER — IBUPROFEN 600 MG PO TABS
600.0000 mg | ORAL_TABLET | Freq: Four times a day (QID) | ORAL | Status: DC
  Administered 2017-01-09 – 2017-01-11 (×8): 600 mg via ORAL
  Filled 2017-01-09 (×8): qty 1

## 2017-01-09 MED ORDER — DIBUCAINE 1 % RE OINT: 1.0000 "application " | TOPICAL_OINTMENT | RECTAL | Status: DC | PRN

## 2017-01-09 MED ORDER — OXYTOCIN 40 UNITS IN LACTATED RINGERS INFUSION - SIMPLE MED: 2.5000 [IU]/h | INTRAVENOUS | Status: AC

## 2017-01-09 MED ORDER — OXYCODONE-ACETAMINOPHEN 5-325 MG PO TABS
1.0000 | ORAL_TABLET | ORAL | Status: DC | PRN
Start: 2017-01-09 — End: 2017-01-11
  Administered 2017-01-11 (×2): 1 via ORAL
  Filled 2017-01-09 (×2): qty 1

## 2017-01-09 MED ORDER — SCOPOLAMINE 1 MG/3DAYS TD PT72
MEDICATED_PATCH | TRANSDERMAL | Status: AC
  Filled 2017-01-09: qty 1

## 2017-01-09 MED ORDER — HYDROMORPHONE HCL 1 MG/ML IJ SOLN: 0.2500 mg | INTRAMUSCULAR | Status: DC | PRN

## 2017-01-09 MED ORDER — EPHEDRINE 5 MG/ML INJ
INTRAVENOUS | Status: AC
  Filled 2017-01-09: qty 10

## 2017-01-09 MED ORDER — ZOLPIDEM TARTRATE 5 MG PO TABS: 5.0000 mg | ORAL_TABLET | Freq: Every evening | ORAL | Status: DC | PRN

## 2017-01-09 MED ORDER — SUCCINYLCHOLINE CHLORIDE 20 MG/ML IJ SOLN
INTRAMUSCULAR | Status: DC | PRN
  Administered 2017-01-09: 140 mg via INTRAVENOUS

## 2017-01-09 MED ORDER — TETANUS-DIPHTH-ACELL PERTUSSIS 5-2.5-18.5 LF-MCG/0.5 IM SUSP: 0.5000 mL | Freq: Once | INTRAMUSCULAR | Status: DC

## 2017-01-09 MED ORDER — ACETAMINOPHEN 325 MG PO TABS: 650.0000 mg | ORAL_TABLET | ORAL | Status: DC | PRN

## 2017-01-09 MED ORDER — SODIUM BICARBONATE 8.4 % IV SOLN
INTRAVENOUS | Status: DC | PRN
  Administered 2017-01-09: 5 mL via EPIDURAL
  Administered 2017-01-09 (×2): 10 mL via EPIDURAL

## 2017-01-09 MED ORDER — PRENATAL MULTIVITAMIN CH
1.0000 | ORAL_TABLET | Freq: Every day | ORAL | Status: DC
  Administered 2017-01-10: 1 via ORAL
  Filled 2017-01-09: qty 1

## 2017-01-09 MED ORDER — WITCH HAZEL-GLYCERIN EX PADS: 1.0000 "application " | MEDICATED_PAD | CUTANEOUS | Status: DC | PRN

## 2017-01-09 MED ORDER — OXYCODONE HCL 5 MG/5ML PO SOLN: 5.0000 mg | Freq: Once | ORAL | Status: DC | PRN

## 2017-01-09 MED ORDER — SUCCINYLCHOLINE CHLORIDE 200 MG/10ML IV SOSY
PREFILLED_SYRINGE | INTRAVENOUS | Status: AC
  Filled 2017-01-09: qty 10

## 2017-01-09 MED ORDER — DIPHENHYDRAMINE HCL 25 MG PO CAPS: 25.0000 mg | ORAL_CAPSULE | Freq: Four times a day (QID) | ORAL | Status: DC | PRN

## 2017-01-09 MED ORDER — PROPOFOL 10 MG/ML IV BOLUS
INTRAVENOUS | Status: DC | PRN
  Administered 2017-01-09: 200 mg via INTRAVENOUS

## 2017-01-09 MED ORDER — CEFAZOLIN SODIUM-DEXTROSE 2-3 GM-%(50ML) IV SOLR
INTRAVENOUS | Status: DC | PRN
  Administered 2017-01-09: 2 g via INTRAVENOUS

## 2017-01-09 MED ORDER — FENTANYL CITRATE (PF) 100 MCG/2ML IJ SOLN
INTRAMUSCULAR | Status: AC
  Filled 2017-01-09: qty 2

## 2017-01-09 MED ORDER — DEXAMETHASONE SODIUM PHOSPHATE 10 MG/ML IJ SOLN
INTRAMUSCULAR | Status: DC | PRN
  Administered 2017-01-09: 10 mg via INTRAVENOUS

## 2017-01-09 MED ORDER — FENTANYL CITRATE (PF) 250 MCG/5ML IJ SOLN
INTRAMUSCULAR | Status: AC
  Filled 2017-01-09: qty 5

## 2017-01-09 MED ORDER — MAGNESIUM SULFATE 40 G IN LACTATED RINGERS - SIMPLE
2.0000 g/h | INTRAVENOUS | Status: AC
  Filled 2017-01-09 (×2): qty 500

## 2017-01-09 MED ORDER — SIMETHICONE 80 MG PO CHEW
80.0000 mg | CHEWABLE_TABLET | Freq: Three times a day (TID) | ORAL | Status: DC
  Administered 2017-01-09 – 2017-01-11 (×6): 80 mg via ORAL
  Filled 2017-01-09 (×5): qty 1

## 2017-01-09 MED ORDER — MORPHINE SULFATE (PF) 0.5 MG/ML IJ SOLN
INTRAMUSCULAR | Status: DC | PRN
  Administered 2017-01-09: 4 mg via EPIDURAL
  Administered 2017-01-09: 1 mg via INTRAVENOUS

## 2017-01-09 MED ORDER — EPHEDRINE SULFATE 50 MG/ML IJ SOLN
INTRAMUSCULAR | Status: DC | PRN
  Administered 2017-01-09 (×2): 10 mg via INTRAVENOUS

## 2017-01-09 MED ORDER — SCOPOLAMINE 1 MG/3DAYS TD PT72
MEDICATED_PATCH | TRANSDERMAL | Status: DC | PRN
  Administered 2017-01-09: 1 via TRANSDERMAL

## 2017-01-09 MED ORDER — OXYCODONE HCL 5 MG PO TABS: 5.0000 mg | ORAL_TABLET | Freq: Once | ORAL | Status: DC | PRN

## 2017-01-09 MED ORDER — SENNOSIDES-DOCUSATE SODIUM 8.6-50 MG PO TABS
2.0000 | ORAL_TABLET | ORAL | Status: DC
  Administered 2017-01-09 – 2017-01-10 (×2): 2 via ORAL
  Filled 2017-01-09 (×2): qty 2

## 2017-01-09 MED ORDER — PROPOFOL 10 MG/ML IV BOLUS
INTRAVENOUS | Status: AC
  Filled 2017-01-09: qty 20

## 2017-01-09 MED ORDER — PROMETHAZINE HCL 25 MG/ML IJ SOLN: 6.2500 mg | INTRAMUSCULAR | Status: DC | PRN

## 2017-01-09 MED ORDER — MENTHOL 3 MG MT LOZG: 1.0000 | LOZENGE | OROMUCOSAL | Status: DC | PRN

## 2017-01-09 MED ORDER — ONDANSETRON HCL 4 MG/2ML IJ SOLN
INTRAMUSCULAR | Status: DC | PRN
  Administered 2017-01-09: 4 mg via INTRAVENOUS

## 2017-01-09 SURGICAL SUPPLY — 35 items
ADH SKN CLS APL DERMABOND .7 (GAUZE/BANDAGES/DRESSINGS)
APL SKNCLS STERI-STRIP NONHPOA (GAUZE/BANDAGES/DRESSINGS) ×1
BENZOIN TINCTURE PRP APPL 2/3 (GAUZE/BANDAGES/DRESSINGS) ×3 IMPLANT
CHLORAPREP W/TINT 26ML (MISCELLANEOUS) ×3 IMPLANT
CLAMP CORD UMBIL (MISCELLANEOUS) IMPLANT
CLOSURE WOUND 1/2 X4 (GAUZE/BANDAGES/DRESSINGS) ×1
CLOTH BEACON ORANGE TIMEOUT ST (SAFETY) ×3 IMPLANT
DERMABOND ADVANCED (GAUZE/BANDAGES/DRESSINGS)
DERMABOND ADVANCED .7 DNX12 (GAUZE/BANDAGES/DRESSINGS) IMPLANT
DRSG OPSITE POSTOP 4X10 (GAUZE/BANDAGES/DRESSINGS) ×3 IMPLANT
ELECT REM PT RETURN 9FT ADLT (ELECTROSURGICAL) ×3
ELECTRODE REM PT RTRN 9FT ADLT (ELECTROSURGICAL) ×1 IMPLANT
EXTRACTOR VACUUM KIWI (MISCELLANEOUS) IMPLANT
GLOVE BIO SURGEON STRL SZ 6 (GLOVE) ×3 IMPLANT
GLOVE BIOGEL PI IND STRL 6 (GLOVE) ×2 IMPLANT
GLOVE BIOGEL PI IND STRL 7.0 (GLOVE) ×1 IMPLANT
GLOVE BIOGEL PI INDICATOR 6 (GLOVE) ×4
GLOVE BIOGEL PI INDICATOR 7.0 (GLOVE) ×2
GOWN STRL REUS W/TWL LRG LVL3 (GOWN DISPOSABLE) ×6 IMPLANT
KIT ABG SYR 3ML LUER SLIP (SYRINGE) ×3 IMPLANT
NDL HYPO 25X5/8 SAFETYGLIDE (NEEDLE) ×1 IMPLANT
NEEDLE HYPO 25X5/8 SAFETYGLIDE (NEEDLE) ×3 IMPLANT
NS IRRIG 1000ML POUR BTL (IV SOLUTION) ×3 IMPLANT
PACK C SECTION WH (CUSTOM PROCEDURE TRAY) ×3 IMPLANT
PAD OB MATERNITY 4.3X12.25 (PERSONAL CARE ITEMS) ×3 IMPLANT
PENCIL SMOKE EVAC W/HOLSTER (ELECTROSURGICAL) ×3 IMPLANT
STRIP CLOSURE SKIN 1/2X4 (GAUZE/BANDAGES/DRESSINGS) ×1 IMPLANT
SUT CHROMIC 0 CTX 36 (SUTURE) ×9 IMPLANT
SUT MON AB 2-0 CT1 27 (SUTURE) ×3 IMPLANT
SUT PDS AB 0 CT1 27 (SUTURE) IMPLANT
SUT PLAIN 0 NONE (SUTURE) IMPLANT
SUT VIC AB 0 CT1 36 (SUTURE) IMPLANT
SUT VIC AB 4-0 KS 27 (SUTURE) IMPLANT
TOWEL OR 17X24 6PK STRL BLUE (TOWEL DISPOSABLE) ×3 IMPLANT
TRAY FOLEY BAG SILVER LF 14FR (SET/KITS/TRAYS/PACK) IMPLANT

## 2017-01-09 NOTE — Anesthesia Preprocedure Evaluation (Signed)
Anesthesia Evaluation  Patient identified by MRN, date of birth, ID band Patient awake    Reviewed: Allergy & Precautions, H&P , NPO status , Patient's Chart, lab work & pertinent test results  History of Anesthesia Complications Negative for: history of anesthetic complications  Airway Mallampati: II  TM Distance: >3 FB Neck ROM: full    Dental no notable dental hx. (+) Teeth Intact   Pulmonary neg pulmonary ROS,    Pulmonary exam normal breath sounds clear to auscultation       Cardiovascular negative cardio ROS Normal cardiovascular exam Rhythm:regular Rate:Normal     Neuro/Psych negative neurological ROS  negative psych ROS   GI/Hepatic negative GI ROS, Neg liver ROS,   Endo/Other  Morbid obesity  Renal/GU negative Renal ROS     Musculoskeletal negative musculoskeletal ROS (+)   Abdominal (+) + obese,   Peds  Hematology negative hematology ROS (+)   Anesthesia Other Findings   Reproductive/Obstetrics (+) Pregnancy                             Anesthesia Physical Anesthesia Plan  ASA: III  Anesthesia Plan: Epidural   Post-op Pain Management:    Induction:   PONV Risk Score and Plan:   Airway Management Planned:   Additional Equipment:   Intra-op Plan:   Post-operative Plan:   Informed Consent: I have reviewed the patients History and Physical, chart, labs and discussed the procedure including the risks, benefits and alternatives for the proposed anesthesia with the patient or authorized representative who has indicated his/her understanding and acceptance.     Plan Discussed with:   Anesthesia Plan Comments:         Anesthesia Quick Evaluation

## 2017-01-09 NOTE — Anesthesia Procedure Notes (Deleted)
Epidural

## 2017-01-09 NOTE — Progress Notes (Signed)
LATE ENTRY NOTE  Patient received her epidural and I came to room to place foley bulb per plan; placement was uncomplicated.  BP was noted to be low (90s/50s) compared to her GHTN values.  Ephedrine x 4 doses was administered with no improvement.  FHT decompensated to 50s x 5-6 minutes despite ephedrine, position changes, pitocin off, O2 applied and foley bulb removal.  No vaginal bleeding noted and cervix still 1 cm.  At this time, stat C/S was called.  Mitchel HonourMegan Milliani Herrada, DO

## 2017-01-09 NOTE — Lactation Note (Signed)
This note was copied from a baby's chart. Lactation Consultation Note  Patient Name: Mary Costa ZOXWR'UToday's Date: 01/09/2017 Reason for consult: Initial assessment;Other (Comment)(Mom requestion LC consultation. )  Baby 4 hours old. Mom concerned that baby was not latching. However, [name], RN had just latched baby right before this LC entered the room. Baby latched deeply and suckled rhythmically with a few swallows noted. Discussed infant behavior--sleepiness--and general anesthesia and Magnesium use. Enc mom to continue offering lots of STS and nurse with cues, and at least put baby to breast every 3 hours d/t baby's weight being less than 6 pounds. Mom had baby latched in football position, and mom comfortable. However, mom having to press breast in order to keep baby's nose from being covered by breast. So, discussed positioning with pillows when mom able to move better, in order to reposition baby more around mom's side--baby "wearing." Mom reports understanding. Mom given [facility] brochure, aware of OP/BFSG and LC phone line assistance after D/C.   Maternal Data    Feeding Feeding Type: Breast Fed  LATCH Score Latch: Grasps breast easily, tongue down, lips flanged, rhythmical sucking.  Audible Swallowing: A few with stimulation  Type of Nipple: Everted at rest and after stimulation  Comfort (Breast/Nipple): Soft / non-tender  Hold (Positioning): Assistance needed to correctly position infant at breast and maintain latch.  LATCH Score: 8  Interventions    Lactation Tools Discussed/Used     Consult Status Consult Status: Follow-up Date: 01/10/17 Follow-up type: In-patient    Sherlyn HayJennifer D Shiron Whetsel 01/09/2017, 2:55 PM

## 2017-01-09 NOTE — Anesthesia Procedure Notes (Signed)
Procedure Name: Intubation Date/Time: 01/09/2017 10:05 AM Performed by: Elgie CongoMalinova, Vivianne Carles H, CRNA Pre-anesthesia Checklist: Patient identified, Emergency Drugs available, Suction available, Patient being monitored and Timeout performed Patient Re-evaluated:Patient Re-evaluated prior to induction Oxygen Delivery Method: Circle system utilized Preoxygenation: Pre-oxygenation with 100% oxygen Induction Type: IV induction, Rapid sequence and Cricoid Pressure applied Laryngoscope Size: Glidescope and 3 Grade View: Grade I Tube type: Oral Tube size: 7.0 mm Number of attempts: 1 Airway Equipment and Method: Stylet and Video-laryngoscopy Placement Confirmation: ETT inserted through vocal cords under direct vision,  positive ETCO2 and breath sounds checked- equal and bilateral Secured at: 22 cm Tube secured with: Tape Dental Injury: Teeth and Oropharynx as per pre-operative assessment

## 2017-01-09 NOTE — Progress Notes (Signed)
Consult with Dr. Sampson GoonFitzgerald in Anesthesiology. Order to recheck the CBC now and if platelets are greater than 100,000, the epidural catheter may be removed.

## 2017-01-09 NOTE — Anesthesia Procedure Notes (Signed)
Epidural Patient location during procedure: OB Start time: 01/09/2017 9:22 AM  Staffing Anesthesiologist: Leonides GrillsEllender, Bandon Sherwin P, MD Performed: anesthesiologist   Preanesthetic Checklist Completed: patient identified, site marked, pre-op evaluation, timeout performed, IV checked, risks and benefits discussed and monitors and equipment checked  Epidural Patient position: sitting Prep: DuraPrep Patient monitoring: heart rate, cardiac monitor, continuous pulse ox and blood pressure Approach: midline Location: L4-L5 Injection technique: LOR air  Needle:  Needle type: Tuohy  Needle gauge: 17 G Needle length: 9 cm Needle insertion depth: 7 cm Catheter type: closed end flexible Catheter size: 19 Gauge Catheter at skin depth: 12 cm Test dose: negative and Other  Assessment Events: blood not aspirated, injection not painful, no injection resistance and negative IV test  Additional Notes Informed consent obtained prior to proceeding including risk of failure, 1% risk of PDPH, risk of minor discomfort and bruising. Discussed alternatives to epidural analgesia and patient desires to proceed.  Timeout performed pre-procedure verifying patient name, procedure, and platelet count.  Patient tolerated procedure well. Reason for block:procedure for pain

## 2017-01-09 NOTE — Transfer of Care (Signed)
Immediate Anesthesia Transfer of Care Note  [facility]  Procedure(s) Performed: CESAREAN SECTION (N/A )  Patient Location: PACU  Anesthesia Type:General  Level of Consciousness: awake, alert  and oriented  Airway & Oxygen Therapy: Patient Spontanous Breathing and Patient connected to nasal cannula oxygen  Post-op Assessment: Report given to RN and Post -op Vital signs reviewed and stable  Post vital signs: Reviewed and stable  Last Vitals:  Vitals:   01/09/17 0954 01/09/17 0957  BP: 116/69 102/64  Pulse: (!) 52 (!) 105  Resp:    Temp:    SpO2:      Last Pain:  Vitals:   01/09/17 0946  TempSrc:   PainSc: 0-No pain         Complications: No apparent anesthesia complications

## 2017-01-09 NOTE — Progress Notes (Signed)
Allen KellJessica M Woodson is a 21 y.o. G1P0 at 7423w1d by ultrasound admitted for induction of labor due to severe gestational hypertension.  Subjective: Patient feeling mild, rare CTX.  Active FM.  No HA, CP/SOB, RUQ pain, or visual disturbance.  Objective: BP (!) 160/94   Pulse 62   Temp 98 F (36.7 C) (Oral)   Resp 18   Ht 5\' 2"  (1.575 m)   Wt 230 lb (104.3 kg)   BMI 42.07 kg/m  I/O last 3 completed shifts: In: 6819.6 [P.O.:2700; I.V.:4119.6] Out: 4400 [Urine:4400] No intake/output data recorded.  FHT:  FHR: 135 bpm, variability: moderate,  accelerations:  Abscent,  decelerations:  Absent UC:   irregular, every 10 minutes SVE:   Dilation: 1 Effacement (%): Thick Station: -3 Exam by:: Dr. Langston MaskerMorris   Labs: Lab Results  Component Value Date   WBC 14.2 (H) 01/08/2017   HGB 11.9 (L) 01/08/2017   HCT 35.1 (L) 01/08/2017   MCV 85.6 01/08/2017   PLT 223 01/08/2017    Assessment / Plan: Induction of labor due to gestational hypertension,  progressing well on pitocin  Labor: No cervical change.  Patient again offered primary C/S which she declines.  Offered foley bulb placement with pitocin continued which she accepts.   Preeclampsia:  on magnesium sulfate and no signs or symptoms of toxicity Fetal Wellbeing:  Category I Pain Control:  plan epidural prior to foley bulb placement I/D:  n/a Anticipated MOD:  NSVD  Glendell Schlottman 01/09/2017, 8:19 AM

## 2017-01-09 NOTE — Anesthesia Postprocedure Evaluation (Signed)
Anesthesia Post Note  Patient: Mary Costa  Procedure(s) Performed: CESAREAN SECTION (N/A )     Patient location during evaluation: [facility] Anesthesia Type: Epidural and General Level of consciousness: awake and alert and oriented Pain management: pain level controlled Vital Signs Assessment: post-procedure vital signs reviewed and stable Respiratory status: spontaneous breathing and nonlabored ventilation Cardiovascular status: stable Postop Assessment: no headache, adequate PO intake, no backache, epidural receding, patient able to bend at knees and no apparent nausea or vomiting Anesthetic complications: no    Last Vitals:  Vitals:   01/09/17 1245 01/09/17 1315  BP: 132/79   Pulse: 80   Resp: (!) 27 (!) 22  Temp:    SpO2: 96%     Last Pain:  Vitals:   01/09/17 1315  TempSrc:   PainSc: 0-No pain   Pain Goal:                 Land O'LakesMalinova,Tracer Gutridge Hristova

## 2017-01-09 NOTE — Op Note (Signed)
Anderson MaltaJessica M Farler PROCEDURE DATE: 01/09/2017  PREOPERATIVE DIAGNOSIS: Intrauterine pregnancy at  7116w1d weeks gestation, GHTN, non-reassuring fetal status  POSTOPERATIVE DIAGNOSIS: The same  PROCEDURE:  Primary Low Transverse Cesarean Section  SURGEON:  Dr. Mitchel HonourMegan Tashira Torre  INDICATIONS: Mary KellJessica M Costa is a 21 y.o. G1P0 at 6016w1d scheduled for cesarean section secondary to stat non-reassuring fetal status.  There was no time to counsel the patient for C/S in an attempt to get the patient to the OR in a timely fashion.    FINDINGS:  Viable female infant in cephalic presentation, APGARs 7,9:  Weight pending. Clear amniotic fluid.  Intact placenta, three vessel cord.  Grossly normal uterus, ovaries and fallopian tubes. .   ANESTHESIA:    Epidural and GETA ESTIMATED BLOOD LOSS: 500 mL ml SPECIMENS: Placenta sent to pathology COMPLICATIONS: None immediate  PROCEDURE IN DETAIL:  The patient received intravenous antibiotics and had sequential compression devices applied to her lower extremities while in the preoperative area. Foley catheter was placed. Betadine prep was performed.  She was placed in a dorsal supine position with leftward tilt.  She was then taken to the operating room where epidural anesthesia was deemed inadequate; decision was made to proceed with GETA.   After an adequate timeout was performed, a Pfannenstiel skin incision was made with scalpel and carried through to the underlying layer of fascia. The fascia was incised in the midline and this incision was extended bilaterally using the Mayo scissors. Kocher clamps were applied to the superior aspect of the fascial incision and the underlying rectus muscles were dissected off bluntly. A similar process was carried out on the inferior aspect of the facial incision. The rectus muscles were separated in the midline bluntly and the peritoneum was entered bluntly.   A transverse hysterotomy was made with a scalpel and extended bilaterally  bluntly. The bladder blade was then removed. The infant was successfully delivered with a single Kiwi vacuum pull, and cord was clamped and cut and infant was handed over to awaiting neonatology team. Uterine massage was then administered and the placenta delivered intact with three-vessel cord. The uterus was cleared of clot and debris.  The hysterotomy was closed with 0 chromic.  A second imbricating suture of 0-chromic was used to reinforce the incision and aid in hemostasis.  The peritoneum and rectus muscles were noted to be hemostatic and were reapproximated using 2-0 monocryl in a running fashion.  The fascia was closed with 0-Vicryl in a running fashion with good restoration of anatomy.  The subcutaneus tissue was copiously irrigated.  The skin was closed with 4-0 vicryl in a subcuticular fashion.  Pt tolerated the procedure will.  All counts were correct x2.  Pt went to the recovery room in stable condition.

## 2017-01-09 NOTE — Plan of Care (Signed)
First night post-op, patient is doing well. Denies nausea, Headache, blurred vision and epigastric pain. Magnesium infusion continues as ordered. Patient has ambulated with help and has eaten a light meal. Family at bedside and supportive.

## 2017-01-10 ENCOUNTER — Encounter (HOSPITAL_COMMUNITY): Payer: Self-pay | Admitting: Obstetrics & Gynecology

## 2017-01-10 LAB — CBC
HCT: 23.7 % — ABNORMAL LOW (ref 36.0–46.0)
Hemoglobin: 8 g/dL — ABNORMAL LOW (ref 12.0–15.0)
MCH: 30 pg (ref 26.0–34.0)
MCHC: 33.8 g/dL (ref 30.0–36.0)
MCV: 88.8 fL (ref 78.0–100.0)
PLATELETS: 232 10*3/uL (ref 150–400)
RBC: 2.67 MIL/uL — ABNORMAL LOW (ref 3.87–5.11)
RDW: 14.4 % (ref 11.5–15.5)
WBC: 14.5 10*3/uL — AB (ref 4.0–10.5)

## 2017-01-10 LAB — COMPREHENSIVE METABOLIC PANEL
ALBUMIN: 2.5 g/dL — AB (ref 3.5–5.0)
ALT: 31 U/L (ref 14–54)
AST: 40 U/L (ref 15–41)
Alkaline Phosphatase: 102 U/L (ref 38–126)
Anion gap: 9 (ref 5–15)
BUN: 19 mg/dL (ref 6–20)
CHLORIDE: 102 mmol/L (ref 101–111)
CO2: 18 mmol/L — ABNORMAL LOW (ref 22–32)
Calcium: 6.9 mg/dL — ABNORMAL LOW (ref 8.9–10.3)
Creatinine, Ser: 1 mg/dL (ref 0.44–1.00)
GFR calc Af Amer: >60 mL/min (ref >60)
GFR calc non Af Amer: >60 mL/min (ref >60)
GLUCOSE: 116 mg/dL — AB (ref 65–99)
POTASSIUM: 4.1 mmol/L (ref 3.5–5.1)
Sodium: 129 mmol/L — ABNORMAL LOW (ref 135–145)
Total Bilirubin: 0.4 mg/dL (ref 0.3–1.2)
Total Protein: 5.4 g/dL — ABNORMAL LOW (ref 6.5–8.1)

## 2017-01-10 NOTE — Progress Notes (Signed)
Subjective: Postpartum Day 1: Cesarean Delivery Patient reports tolerating PO, + flatus and no problems voiding.    Objective: Vital signs in last 24 hours: Temp:  [97.5 F (36.4 C)-98.6 F (37 C)] 98 F (36.7 C) (11/16 0730) Pulse Rate:  [52-105] 81 (11/16 0730) Resp:  [14-27] 18 (11/16 0730) BP: (93-157)/(51-109) 118/57 (11/16 0730) SpO2:  [96 %-100 %] 100 % (11/16 0730)  Physical Exam:  General: alert, cooperative and no distress Lochia: appropriate Uterine Fundus: firm Incision: healing well DVT Evaluation: No evidence of DVT seen on physical exam. Lungs CTA DTR 2+ Recent Labs    01/09/17 1959 01/10/17 0507  HGB 9.8* 8.0*  HCT 29.6* 23.7*    Assessment/Plan: Status post Cesarean section. Doing well postoperatively.  D/C magnesium sulfate @ 10:30 am Labs in am.  Mary Costa 01/10/2017, 8:44 AM

## 2017-01-10 NOTE — Lactation Note (Signed)
This note was copied from a baby's chart. Lactation Consultation Note  Patient Name: Mary Jasper LoserJessica Manus ZOXWR'UToday's Date: 01/10/2017 Reason for consult: Follow-up assessment;Infant < 6lbs   Follow up with mom of 29 hour old infant. Infant breast feeding on both breasts about every 3 hours per mom. Infant with 3 voids and 3 stool in the last 24 hours. Infant was circumcised this morning and has been BF well this afternoon. Mom reports nipple tenderness with initial latch, she has Lanolin at the bedside, enc mom to use coconut oil to nipples, Mary GongZelvia Franks, RN to get mom Coconut oil.   Enc mom to not allow infant to go < 3 hours between feeds. Enc mom to stimulate infant as needed to maintain active suckling at the breast. Enc mom to call out for assistance as needed if not feeding well.   Mom reports no questions/concerns at this time.    Maternal Data Formula Feeding for Exclusion: No Has patient been taught Hand Expression?: Yes  Feeding    LATCH Score                   Interventions    Lactation Tools Discussed/Used     Consult Status Consult Status: Follow-up Date: 01/11/17 Follow-up type: In-patient    Silas FloodSharon S Corky Blumstein 01/10/2017, 3:46 PM

## 2017-01-11 LAB — COMPREHENSIVE METABOLIC PANEL
ALK PHOS: 89 U/L (ref 38–126)
ALT: 30 U/L (ref 14–54)
ANION GAP: 9 (ref 5–15)
AST: 39 U/L (ref 15–41)
Albumin: 2.7 g/dL — ABNORMAL LOW (ref 3.5–5.0)
BUN: 17 mg/dL (ref 6–20)
CALCIUM: 7.4 mg/dL — AB (ref 8.9–10.3)
CO2: 22 mmol/L (ref 22–32)
Chloride: 107 mmol/L (ref 101–111)
Creatinine, Ser: 0.8 mg/dL (ref 0.44–1.00)
GFR calc Af Amer: >60 mL/min (ref >60)
Glucose, Bld: 86 mg/dL (ref 65–99)
Potassium: 4.1 mmol/L (ref 3.5–5.1)
SODIUM: 138 mmol/L (ref 135–145)
TOTAL PROTEIN: 5.7 g/dL — AB (ref 6.5–8.1)
Total Bilirubin: 0.3 mg/dL (ref 0.3–1.2)

## 2017-01-11 LAB — CBC
HCT: 21.2 % — ABNORMAL LOW (ref 36.0–46.0)
HEMOGLOBIN: 7 g/dL — AB (ref 12.0–15.0)
MCH: 29.8 pg (ref 26.0–34.0)
MCHC: 33 g/dL (ref 30.0–36.0)
MCV: 90.2 fL (ref 78.0–100.0)
PLATELETS: 187 10*3/uL (ref 150–400)
RBC: 2.35 MIL/uL — ABNORMAL LOW (ref 3.87–5.11)
RDW: 14.6 % (ref 11.5–15.5)
WBC: 10.9 10*3/uL — ABNORMAL HIGH (ref 4.0–10.5)

## 2017-01-11 MED ORDER — OXYCODONE HCL 5 MG PO TABS: 5.0000 mg | ORAL_TABLET | Freq: Four times a day (QID) | ORAL | 0 refills | Status: DC | PRN

## 2017-01-11 MED ORDER — IBUPROFEN 600 MG PO TABS: 600.0000 mg | ORAL_TABLET | Freq: Four times a day (QID) | ORAL | 2 refills | Status: DC | PRN

## 2017-01-11 MED ORDER — FERROUS SULFATE 325 (65 FE) MG PO TABS: 325.0000 mg | ORAL_TABLET | Freq: Two times a day (BID) | ORAL | 3 refills | Status: DC

## 2017-01-11 MED ORDER — ACETAMINOPHEN 325 MG PO TABS: 650.0000 mg | ORAL_TABLET | Freq: Four times a day (QID) | ORAL | 1 refills | Status: DC | PRN

## 2017-01-11 NOTE — Progress Notes (Signed)
CSW acknowledges consult for LPNC. CSW completed chart review and saw noted that patient began PNC prior to becoming 28 weeks and 1 day. CSW screens consult out as any patient that receives PNC prior to 28 weeks and 1 day does not necessitate a CSW consult.   Additionally, CSW acknowledges consult for hx of substance abuse. PNC records indicate MOB quit alcohol at 5 months and THC two months ago.  Referral was screened out due to the following: ~MOB had no documented substance use after initial prenatal visit/+UPT. ~MOB had no positive drug screens after initial prenatal visit/+UPT. ~Baby's UDS is negative.  Please consult CSW if current concerns arise or by MOB's request.  CSW will monitor CDS results and make report to Child Protective Services if warranted.  Mary Costa, MSW, LCSW-A Clinical Social Worker  [facility]  Office: [phone]    

## 2017-01-11 NOTE — Discharge Instructions (Signed)
Call office for appointment in 3-4 days

## 2017-01-11 NOTE — Progress Notes (Signed)
Subjective: Postpartum Day 2: Cesarean Delivery Patient reports tolerating PO, + flatus and no problems voiding.    Objective: Vital signs in last 24 hours: Temp:  [98.3 F (36.8 C)-98.8 F (37.1 C)] 98.8 F (37.1 C) (11/17 0402) Pulse Rate:  [86-108] 92 (11/17 0402) Resp:  [18-20] 18 (11/17 0402) BP: (131-149)/(62-79) 146/79 (11/17 0402) SpO2:  [99 %-100 %] 100 % (11/17 0402)  Physical Exam:  General: alert, cooperative and no distress Lochia: appropriate Uterine Fundus: firm Incision: healing well DVT Evaluation: No evidence of DVT seen on physical exam.  Recent Labs    01/10/17 0507 01/11/17 0511  HGB 8.0* 7.0*  HCT 23.7* 21.2*    Assessment/Plan: Status post Cesarean section. Doing well postoperatively.  Observe today for BP. Probable D/C this pm  Roselle LocusJames E Vickii Volland II 01/11/2017, 8:09 AM

## 2017-01-11 NOTE — Lactation Note (Signed)
This note was copied from a baby's chart. Lactation Consultation Note  Patient Name: Boy Jasper LoserJessica Bowery ZOXWR'UToday's Date: 01/11/2017   Visited with Mom of 8254 hr old baby.  This baby is 5lbs 7.8oz today, weight loss of 8%.   Supplementing after breastfeeding.  Mom gave baby 18 ml an hour ago, and baby noted to be cueing and fussing.  Offered to assist with breastfeeding baby, but Mom declined as she is not feeling up to it.  Mom asked for formula.   Volume parameter handout given.  Explained to parents that baby is 6854 hrs old, and needs increased supplement.  Instructed to give baby 30-60 ml per feeding if baby doesn't go to the breast.   Pumping after supplementing importance shared.  Talked about milk supply etc. Encouraged STS. Feeding baby on cue, at least after 3 hrs due to weight loss.  Encouraged supp after breastfeeding [facility]  Mom has a Medela PIS at home. Mom aware of OP lactation support available. [facility]. To call prn for assistance.   Judee ClaraSmith, Oretha Weismann E 01/11/2017, 4:55 PM

## 2017-01-11 NOTE — Discharge Summary (Signed)
Obstetric Discharge Summary Reason for Admission: induction of labor Prenatal Procedures: none Intrapartum Procedures: cesarean: low cervical, transverse Postpartum Procedures: none Complications-Operative and Postpartum: none Hemoglobin  Date Value Ref Range Status  01/11/2017 7.0 (L) 12.0 - 15.0 g/dL Final   HCT  Date Value Ref Range Status  01/11/2017 21.2 (L) 36.0 - 46.0 % Final    Physical Exam:  General: alert, cooperative and no distress Lochia: appropriate Uterine Fundus: firm Incision: healing well DVT Evaluation: No evidence of DVT seen on physical exam.  Discharge Diagnoses: Term Pregnancy-delivered  Discharge Information: Date: 01/11/2017 Activity: pelvic rest Diet: routine Medications: PNV, Ibuprofen and oxycodone, ferrous sulfate Condition: stable Instructions: refer to practice specific booklet Discharge to: home   Newborn Data: Live born female  Birth Weight: 5 lb 15.9 oz (2720 g) APGAR: 7, 9  Newborn Delivery   Birth date/time:  01/09/2017 10:15:00 Delivery type:  C-Section, Vacuum Assisted C-section categorization:  Primary     Home with mother.  Mary Costa 01/11/2017, 8:16 AM

## 2017-01-12 ENCOUNTER — Other Ambulatory Visit: Payer: Self-pay

## 2017-01-12 ENCOUNTER — Inpatient Hospital Stay (HOSPITAL_COMMUNITY)
Admission: AD | Admit: 2017-01-12 | Discharge: 2017-01-12 | Disposition: A | Discharge status: Home / Self Care | Payer: 59 | Source: Ambulatory Visit | Attending: Obstetrics and Gynecology | Admitting: Obstetrics and Gynecology

## 2017-01-12 ENCOUNTER — Encounter (HOSPITAL_COMMUNITY): Payer: Self-pay | Admitting: *Deleted

## 2017-01-12 DIAGNOSIS — Z79891 Long term (current) use of opiate analgesic: Secondary | ICD-10-CM | POA: Insufficient documentation

## 2017-01-12 DIAGNOSIS — Z791 Long term (current) use of non-steroidal anti-inflammatories (NSAID): Secondary | ICD-10-CM | POA: Insufficient documentation

## 2017-01-12 DIAGNOSIS — G8918 Other acute postprocedural pain: Secondary | ICD-10-CM | POA: Diagnosis not present

## 2017-01-12 DIAGNOSIS — D649 Anemia, unspecified: Secondary | ICD-10-CM | POA: Diagnosis not present

## 2017-01-12 DIAGNOSIS — O165 Unspecified maternal hypertension, complicating the puerperium: Secondary | ICD-10-CM | POA: Insufficient documentation

## 2017-01-12 DIAGNOSIS — Z9889 Other specified postprocedural states: Secondary | ICD-10-CM | POA: Insufficient documentation

## 2017-01-12 DIAGNOSIS — Z79899 Other long term (current) drug therapy: Secondary | ICD-10-CM | POA: Insufficient documentation

## 2017-01-12 LAB — COMPREHENSIVE METABOLIC PANEL
ALK PHOS: 84 U/L (ref 38–126)
ALT: 42 U/L (ref 14–54)
AST: 48 U/L — ABNORMAL HIGH (ref 15–41)
Albumin: 2.8 g/dL — ABNORMAL LOW (ref 3.5–5.0)
Anion gap: 8 (ref 5–15)
BILIRUBIN TOTAL: 0.6 mg/dL (ref 0.3–1.2)
BUN: 12 mg/dL (ref 6–20)
CALCIUM: 8.3 mg/dL — AB (ref 8.9–10.3)
CO2: 22 mmol/L (ref 22–32)
CREATININE: 0.7 mg/dL (ref 0.44–1.00)
Chloride: 107 mmol/L (ref 101–111)
GFR calc Af Amer: >60 mL/min (ref >60)
GFR calc non Af Amer: >60 mL/min (ref >60)
Glucose, Bld: 85 mg/dL (ref 65–99)
Potassium: 4 mmol/L (ref 3.5–5.1)
SODIUM: 137 mmol/L (ref 135–145)
TOTAL PROTEIN: 5.8 g/dL — AB (ref 6.5–8.1)

## 2017-01-12 LAB — CBC WITH DIFFERENTIAL/PLATELET
BASOS ABS: 0 10*3/uL (ref 0.0–0.1)
BASOS PCT: 0 %
EOS PCT: 3 %
Eosinophils Absolute: 0.3 10*3/uL (ref 0.0–0.7)
HEMATOCRIT: 21.3 % — AB (ref 36.0–46.0)
HEMOGLOBIN: 6.8 g/dL — AB (ref 12.0–15.0)
LYMPHS PCT: 18 %
Lymphs Abs: 1.7 10*3/uL (ref 0.7–4.0)
MCH: 28.9 pg (ref 26.0–34.0)
MCHC: 31.9 g/dL (ref 30.0–36.0)
MCV: 90.6 fL (ref 78.0–100.0)
MONO ABS: 0.3 10*3/uL (ref 0.1–1.0)
MONOS PCT: 3 %
NEUTROS PCT: 76 %
Neutro Abs: 7.4 10*3/uL (ref 1.7–7.7)
OTHER: 0 %
Platelets: 199 10*3/uL (ref 150–400)
RBC: 2.35 MIL/uL — ABNORMAL LOW (ref 3.87–5.11)
RDW: 14.6 % (ref 11.5–15.5)
WBC: 9.7 10*3/uL (ref 4.0–10.5)

## 2017-01-12 MED ORDER — HYDROMORPHONE HCL 2 MG/ML IJ SOLN
2.0000 mg | Freq: Once | INTRAMUSCULAR | Status: AC
  Administered 2017-01-12: 2 mg via INTRAMUSCULAR
  Filled 2017-01-12: qty 1

## 2017-01-12 MED ORDER — OXYCODONE-ACETAMINOPHEN 5-325 MG PO TABS
1.0000 | ORAL_TABLET | Freq: Once | ORAL | Status: AC
  Administered 2017-01-12: 1 via ORAL
  Filled 2017-01-12: qty 1

## 2017-01-12 MED ORDER — LABETALOL HCL 200 MG PO TABS
200.0000 mg | ORAL_TABLET | Freq: Two times a day (BID) | ORAL | 0 refills | Status: DC
Start: 2017-01-12 — End: 2023-05-03

## 2017-01-12 NOTE — MAU Note (Signed)
Honeycomb dsg on abd incision. Some old dark drainage noted. Pt states incision is ok but burning is deeper on RLQ

## 2017-01-12 NOTE — Discharge Instructions (Signed)

## 2017-01-12 NOTE — ED Notes (Signed)
CRITICAL VALUE ALERT  Critical Value:  Hemoglobin 6.8  Date & Time Notied:  11/18 at 0651  Provider Notified: Jeralyn RuthsWalida Ahmed  Orders Received/Actions taken: see provider notes

## 2017-01-12 NOTE — MAU Provider Note (Signed)
History   [id number]   Chief Complaint  Patient presents with  . Incisional Pain    HPI Mary Costa is a 21 y.o. female  G1P1001 presents status post emergency csection for fetal bradycardia on 01/09/17.  Discharged home last night.  Patient reports she has not picked up her pain medication since discharge.  Last Percocet was at 1630 yesterday before discharge.  Bleeding reported as minimal.  Pain is described as a burning sensation deep in the abdomen.  Rated a 10 out of 10 with movement.  While in bed pain is rated a 4 out of 10.  Denies headache, vision changes, or epigastric pain.    No LMP recorded.  OB History  Gravida Para Term Preterm AB Living  1 1 1     1   SAB TAB Ectopic Multiple Live Births          1    # Outcome Date GA Lbr Len/2nd Weight Sex Delivery Anes PTL Lv  1 Term 01/08/17 2621w0d   M CS-LTranv   LIV      Past Medical History:  Diagnosis Date  . Medical history non-contributory     History reviewed. No pertinent family history.  Social History   Socioeconomic History  . Marital status: Single    Spouse name: None  . Number of children: None  . Years of education: None  . Highest education level: None  Social Needs  . Financial resource strain: None  . Food insecurity - worry: None  . Food insecurity - inability: None  . Transportation needs - medical: None  . Transportation needs - non-medical: None  Occupational History  . None  Tobacco Use  . Smoking status: Never Smoker  . Smokeless tobacco: Never Used  Substance and Sexual Activity  . Alcohol use: No  . Drug use: No  . Sexual activity: Yes    Birth control/protection: None  Other Topics Concern  . None  Social History Narrative  . None    No Known Allergies  No current facility-administered medications on file prior to encounter.    Current Outpatient Medications on File Prior to Encounter  Medication Sig Dispense Refill  . Prenatal Vit-Fe Fumarate-FA (PRENATAL  MULTIVITAMIN) TABS tablet Take 1 tablet daily at 12 noon by mouth.    Marland Kitchen. acetaminophen (TYLENOL) 325 MG tablet Take 2 tablets (650 mg total) every 6 (six) hours as needed by mouth (for pain scale < 4). 60 tablet 1  . ferrous sulfate (FERROUSUL) 325 (65 FE) MG tablet Take 1 tablet (325 mg total) 2 (two) times daily with a meal by mouth. 60 tablet 3  . ibuprofen (ADVIL,MOTRIN) 600 MG tablet Take 1 tablet (600 mg total) every 6 (six) hours as needed by mouth. 60 tablet 2  . oxyCODONE (ROXICODONE) 5 MG immediate release tablet Take 1 tablet (5 mg total) every 6 (six) hours as needed by mouth for severe pain. 10 tablet 0     Review of Systems  Constitutional: Negative for chills and fever.  Eyes: Negative for photophobia and visual disturbance.  Respiratory: Positive for cough. Negative for chest tightness, shortness of breath and wheezing.   Gastrointestinal: Positive for abdominal pain (incisional site). Negative for constipation, nausea and vomiting.  Genitourinary: Positive for pelvic pain (incisional site) and vaginal bleeding. Negative for difficulty urinating, dysuria and frequency. Decreased urine volume: scant.  Neurological: Negative for light-headedness and headaches.  All other systems reviewed and are negative.    Physical Exam  Vitals:   01/12/17 0538 01/12/17 0604 01/12/17 0641 01/12/17 0709  BP: (!) 152/90 (!) 168/79 (!) 159/79 (!) 145/78  Pulse:  86 73 78  Resp:      Temp:      Weight:      Height:        Physical Exam  Constitutional: She is oriented to person, place, and time. She appears well-developed and well-nourished.  HENT:  Head: Normocephalic.  Neck: Normal range of motion. Neck supple.  Cardiovascular: Normal rate, regular rhythm and normal heart sounds.  Respiratory: Effort normal and breath sounds normal. No respiratory distress.  GI: Soft. There is no tenderness. There is no guarding.  Honeycomb dressing in place; old blood seen; plan to remove once  pain level decreases.   Genitourinary: No bleeding in the vagina.  Musculoskeletal: Normal range of motion. She exhibits no edema.  Neurological: She is alert and oriented to person, place, and time. She has normal reflexes.  Skin: Skin is warm and dry.    MAU Course  Procedures  MDM Consulted with Dr.Tomblin > reviewed HPI, exam, last medication given, and reviewed vital signs > obtain labs and treat for pain.  Reassess after medication given.  Dressing: Honeycomb dressing removed once patient reported pain 0/10.   Incision site healing well; well approximated, no signs of infection; no palpable mass.  New Honeycomb dressing placed.    Blood pressure since pain medication decreased to 148/79 and 152/79  Results for orders placed or performed during the hospital encounter of 01/12/17 (from the past 24 hour(s))  CBC with Differential     Status: Abnormal   Collection Time: 01/12/17  6:17 AM  Result Value Ref Range   WBC 9.7 4.0 - 10.5 K/uL   RBC 2.35 (L) 3.87 - 5.11 MIL/uL   Hemoglobin 6.8 (LL) 12.0 - 15.0 g/dL   HCT 66.421.3 (L) 40.336.0 - 47.446.0 %   MCV 90.6 78.0 - 100.0 fL   MCH 28.9 26.0 - 34.0 pg   MCHC 31.9 30.0 - 36.0 g/dL   RDW 25.914.6 56.311.5 - 87.515.5 %   Platelets 199 150 - 400 K/uL   Neutrophils Relative % 76 %   Lymphocytes Relative 18 %   Monocytes Relative 3 %   Eosinophils Relative 3 %   Basophils Relative 0 %   Other 0 %   Neutro Abs 7.4 1.7 - 7.7 K/uL   Lymphs Abs 1.7 0.7 - 4.0 K/uL   Monocytes Absolute 0.3 0.1 - 1.0 K/uL   Eosinophils Absolute 0.3 0.0 - 0.7 K/uL   Basophils Absolute 0.0 0.0 - 0.1 K/uL  Comprehensive metabolic panel     Status: Abnormal   Collection Time: 01/12/17  6:17 AM  Result Value Ref Range   Sodium 137 135 - 145 mmol/L   Potassium 4.0 3.5 - 5.1 mmol/L   Chloride 107 101 - 111 mmol/L   CO2 22 22 - 32 mmol/L   Glucose, Bld 85 65 - 99 mg/dL   BUN 12 6 - 20 mg/dL   Creatinine, Ser 6.430.70 0.44 - 1.00 mg/dL   Calcium 8.3 (L) 8.9 - 10.3 mg/dL   Total  Protein 5.8 (L) 6.5 - 8.1 g/dL   Albumin 2.8 (L) 3.5 - 5.0 g/dL   AST 48 (H) 15 - 41 U/L   ALT 42 14 - 54 U/L   Alkaline Phosphatase 84 38 - 126 U/L   Total Bilirubin 0.6 0.3 - 1.2 mg/dL   GFR calc non Af Amer >  60 >60 mL/min   GFR calc Af Amer >60 >60 mL/min   Anion gap 8 5 - 15   0735 Reviewed vital signs/labs and patient status post medication with Dr. Henderson Cloud > discharge home with Labetalol 200 mg BID and have follow-up in office in 2-3 days  Assessment and Plan  Post-op Pain Postpartum Hypertension Anemia  Plan: Discharge home RX Labetalol 200 mg BID  Pick up RX for Oxycodone on way home Reviewed warning signs of preeclampsia Continue iron for anemia  Marlis Edelson, CNM 01/12/2017 8:43 AM

## 2017-01-12 NOTE — MAU Note (Signed)
Had emergency C/S Weds.Went home last night. Having burning pain R side of incision that is worse with movement. Sitting and standing is ok but getting from sitting to standing and vice versa is very painful. Having very little vag bleeding

## 2017-01-30 ENCOUNTER — Inpatient Hospital Stay (HOSPITAL_COMMUNITY)
Admission: AD | Admit: 2017-01-30 | Discharge: 2017-01-30 | Disposition: A | Discharge status: Home / Self Care | Payer: 59 | Source: Ambulatory Visit | Attending: Obstetrics and Gynecology | Admitting: Obstetrics and Gynecology

## 2017-01-30 DIAGNOSIS — O900 Disruption of cesarean delivery wound: Secondary | ICD-10-CM | POA: Diagnosis not present

## 2017-01-30 DIAGNOSIS — O9089 Other complications of the puerperium, not elsewhere classified: Secondary | ICD-10-CM | POA: Diagnosis present

## 2017-01-30 DIAGNOSIS — Z5189 Encounter for other specified aftercare: Secondary | ICD-10-CM

## 2017-01-30 NOTE — MAU Note (Signed)
Pt states her incision site has been itching and she thinks there may be an opening.  No odor or drainage.  Delivered 01/09/17.

## 2017-01-30 NOTE — Discharge Instructions (Signed)

## 2017-01-30 NOTE — MAU Provider Note (Signed)
History     CSN: [id number]  Arrival date and time: 01/30/17 1806   None     Chief Complaint  Patient presents with  . incision site itching, feels like its opening   HPI   Mary Costa is a 21 y.o. female G1P1001 status post primary cesarean section on 11/15. States she is concerned about her incision. States the incision is itchy and her significant other told her there was a hole in the incision. No fever. No pain at the side. No drainage. On Lebetalol for postpartum HTN, did not taken her medication today. No HA, or changes in vision.   OB History    Gravida Para Term Preterm AB Living   1 1 1     1    SAB TAB Ectopic Multiple Live Births           1      Past Medical History:  Diagnosis Date  . Medical history non-contributory     Past Surgical History:  Procedure Laterality Date  . CESAREAN SECTION N/A 01/09/2017   Procedure: CESAREAN SECTION;  Surgeon: Mitchel HonourMorris, Megan, DO;  Location: [facility];  Service: Obstetrics;  Laterality: N/A;    No family history on file.  Social History   Tobacco Use  . Smoking status: Never Smoker  . Smokeless tobacco: Never Used  Substance Use Topics  . Alcohol use: No  . Drug use: No    Allergies: No Known Allergies  Medications Prior to Admission  Medication Sig Dispense Refill Last Dose  . acetaminophen (TYLENOL) 325 MG tablet Take 2 tablets (650 mg total) every 6 (six) hours as needed by mouth (for pain scale < 4). 60 tablet 1   . ferrous sulfate (FERROUSUL) 325 (65 FE) MG tablet Take 1 tablet (325 mg total) 2 (two) times daily with a meal by mouth. 60 tablet 3   . ibuprofen (ADVIL,MOTRIN) 600 MG tablet Take 1 tablet (600 mg total) every 6 (six) hours as needed by mouth. 60 tablet 2   . labetalol (NORMODYNE) 200 MG tablet Take 1 tablet (200 mg total) 2 (two) times daily by mouth. 60 tablet 0   . oxyCODONE (ROXICODONE) 5 MG immediate release tablet Take 1 tablet (5 mg total) every 6 (six) hours as needed by  mouth for severe pain. 10 tablet 0   . Prenatal Vit-Fe Fumarate-FA (PRENATAL MULTIVITAMIN) TABS tablet Take 1 tablet daily at 12 noon by mouth.   01/11/2017 at Unknown time   No results found for this or any previous visit (from the past 48 hour(s)).  Review of Systems  Constitutional: Negative for fever.  Gastrointestinal: Negative for abdominal pain.  Skin: Positive for wound.   Physical Exam   Blood pressure (!) 146/81, pulse 86, temperature 98.4 F (36.9 C), temperature source Oral, resp. rate 16, SpO2 100 %, currently breastfeeding.  Physical Exam  Constitutional: She is oriented to person, place, and time. She appears well-developed and well-nourished. No distress.  HENT:  Head: Normocephalic.  GI: Soft. Normal appearance.    5 mm, superficial opening in center of the lower transverse incision. Unable to insert tip of Q-tip. Beefy red granulation tissue. No drainage, no odor.  Additional 1 mm superficial opening at the right, lateral edge of the lower, transverse incision.   Musculoskeletal: Normal range of motion.  Neurological: She is alert and oriented to person, place, and time.  Skin: Skin is warm. She is not diaphoretic.  Psychiatric: Her behavior is normal.  MAU Course  Procedures  None  MDM  Discussed incision and BP reading with Dr. Rana SnareLowe. Ok for DC.   Assessment and Plan   A:  1. Visit for wound check   2. Postpartum cesarean wound disruption     P:  Discharge home in stable condition  Follow up with OB; call the office tomorrow for follow up. Keep the wound clean, dry and intact Take labetalol as scheduled  Venia Carbonasch, Jennifer I, NP 01/30/2017 8:00 PM

## 2017-02-07 NOTE — H&P (Signed)
Mary Costa is a 21 y.o. female presenting for IOL due to gestational HTN requiring labetalol.  GBS-. OB History as of 01/08/17    Gravida Para Term Preterm AB Living   1             SAB TAB Ectopic Multiple Live Births                 Past Medical History:  Diagnosis Date  . Medical history non-contributory    Past Surgical History:  Procedure Laterality Date  . CESAREAN SECTION N/A 01/09/2017   Procedure: CESAREAN SECTION;  Surgeon: Mitchel HonourMorris, Megan, DO;  Location: [facility];  Service: Obstetrics;  Laterality: N/A;   Family History: family history is not on file. Social History:  reports that  has never smoked. she has never used smokeless tobacco. She reports that she does not drink alcohol or use drugs.     Maternal Diabetes: No Genetic Screening: Normal Maternal Ultrasounds/Referrals: Normal Fetal Ultrasounds or other Referrals:  None Maternal Substance Abuse:  No Significant Maternal Medications:  None Significant Maternal Lab Results:  None Other Comments:  None  ROS History Dilation: 1 Effacement (%): Thick Station: -3 Exam by:  Blood pressure (!) 147/93, pulse 86, temperature 98.4 F (36.9 Costa), temperature source Oral, resp. rate 18, height 5\' 2"  (1.575 m), weight 230 lb (104.3 kg), SpO2 100 %, currently breastfeeding. Exam Physical Exam  Prenatal labs: ABO, Rh: --/--/B POS (11/13 1912) Antibody: NEG (11/13 1911) Rubella: Immune (07/18 0000) RPR: Non Reactive (11/13 1911)  HBsAg: Negative (07/18 0000)  HIV: Non-reactive (07/18 0000)  GBS: Negative (07/18 0000)   Assessment/Plan: IUP at term Gest HTN with markedly elevated BPs requiring labetalol Magnesium, and cytotec IOL This patient was admitted and seen by Dr Wende BushyGrewal   Mary Costa

## 2022-01-27 ENCOUNTER — Ambulatory Visit: Payer: Self-pay

## 2022-03-22 ENCOUNTER — Ambulatory Visit
Admission: RE | Admit: 2022-03-22 | Discharge: 2022-03-22 | Disposition: A | Discharge status: Home / Self Care | Payer: 59 | Source: Ambulatory Visit | Attending: Urgent Care | Admitting: Urgent Care

## 2022-03-22 ENCOUNTER — Ambulatory Visit: Admit: 2022-03-22 | Discharge status: Absent without leave | Payer: Self-pay

## 2022-03-22 VITALS — BP 132/85 | HR 114 | Temp 98.4°F | Resp 16

## 2022-03-22 DIAGNOSIS — J020 Streptococcal pharyngitis: Secondary | ICD-10-CM

## 2022-03-22 LAB — POCT RAPID STREP A (OFFICE): Rapid Strep A Screen: POSITIVE — AB

## 2022-03-22 MED ORDER — AMOXICILLIN 875 MG PO TABS: 875.0000 mg | ORAL_TABLET | Freq: Two times a day (BID) | ORAL | 0 refills | Status: DC

## 2022-03-22 NOTE — ED Triage Notes (Signed)
Sore throat, trouble swallowing since yesterday. Reports fatigue. Last took tylenol 0600 this am. Bilateral swollen tonsils with exudate on assessment

## 2022-03-22 NOTE — ED Provider Notes (Signed)
[facility]  Note:  This document was prepared using Dragon voice recognition software and may include unintentional dictation errors.  MRN: [id number] DOB: [birth year]  Subjective:   Mary Costa is a 27 y.o. female presenting for 1 day history of acute onset persistent throat pain, painful swallowing, fever, fatigue. Had multiple exposures to strep throat. No cough, chest pain, shob.   No current facility-administered medications for this encounter.  Current Outpatient Medications:    acetaminophen (TYLENOL) 325 MG tablet, Take 2 tablets (650 mg total) every 6 (six) hours as needed by mouth (for pain scale < 4)., Disp: 60 tablet, Rfl: 1   ferrous sulfate (FERROUSUL) 325 (65 FE) MG tablet, Take 1 tablet (325 mg total) 2 (two) times daily with a meal by mouth., Disp: 60 tablet, Rfl: 3   ibuprofen (ADVIL,MOTRIN) 600 MG tablet, Take 1 tablet (600 mg total) every 6 (six) hours as needed by mouth., Disp: 60 tablet, Rfl: 2   labetalol (NORMODYNE) 200 MG tablet, Take 1 tablet (200 mg total) 2 (two) times daily by mouth., Disp: 60 tablet, Rfl: 0   oxyCODONE (ROXICODONE) 5 MG immediate release tablet, Take 1 tablet (5 mg total) every 6 (six) hours as needed by mouth for severe pain., Disp: 10 tablet, Rfl: 0   Prenatal Vit-Fe Fumarate-FA (PRENATAL MULTIVITAMIN) TABS tablet, Take 1 tablet daily at 12 noon by mouth., Disp: , Rfl:    No Known Allergies  Past Medical History:  Diagnosis Date   Medical history non-contributory      Past Surgical History:  Procedure Laterality Date   CESAREAN SECTION N/A 01/09/2017   Procedure: CESAREAN SECTION;  Surgeon: Linda Hedges, DO;  Location: [facility];  Service: Obstetrics;  Laterality: N/A;    No family history on file.  Social History   Tobacco Use   Smoking status: Never   Smokeless tobacco: Never  Substance Use Topics   Alcohol use: No   Drug use: No    ROS   Objective:   Vitals: BP 132/85 (BP  Location: Right Arm)   Pulse (!) 114   Temp 98.4 F (36.9 C) (Oral)   Resp 16   SpO2 98%   Physical Exam Constitutional:      General: She is not in acute distress.    Appearance: Normal appearance. She is well-developed. She is not ill-appearing, toxic-appearing or diaphoretic.  HENT:     Head: Normocephalic and atraumatic.     Nose: Nose normal.     Mouth/Throat:     Mouth: Mucous membranes are moist.     Pharynx: Pharyngeal swelling, oropharyngeal exudate and posterior oropharyngeal erythema present. No uvula swelling.     Tonsils: Tonsillar exudate present. No tonsillar abscesses. 1+ on the right. 1+ on the left.  Eyes:     General: No scleral icterus.       Right eye: No discharge.        Left eye: No discharge.     Extraocular Movements: Extraocular movements intact.  Cardiovascular:     Rate and Rhythm: Normal rate.  Pulmonary:     Effort: Pulmonary effort is normal.  Skin:    General: Skin is warm and dry.  Neurological:     General: No focal deficit present.     Mental Status: She is alert and oriented to person, place, and time.  Psychiatric:        Mood and Affect: Mood normal.        Behavior: Behavior  normal.     Assessment and Plan :   PDMP not reviewed this encounter.  1. Strep pharyngitis     Will treat empirically for pharyngitis given physical exam findings.  Patient is to start amoxicillin, use supportive care otherwise. Counseled patient on potential for adverse effects with medications prescribed/recommended today, ER and return-to-clinic precautions discussed, patient verbalized understanding.    Jaynee Eagles, [place] 03/22/22 [phone]

## 2023-01-06 ENCOUNTER — Other Ambulatory Visit (HOSPITAL_BASED_OUTPATIENT_CLINIC_OR_DEPARTMENT_OTHER): Payer: Self-pay

## 2023-01-06 MED ORDER — WEGOVY 0.25 MG/0.5ML ~~LOC~~ SOAJ
0.2500 mg | SUBCUTANEOUS | 0 refills | Status: DC
  Filled 2023-01-06 – 2023-02-04 (×2): qty 2, 28d supply, fill #0

## 2023-01-07 ENCOUNTER — Other Ambulatory Visit (HOSPITAL_BASED_OUTPATIENT_CLINIC_OR_DEPARTMENT_OTHER): Payer: Self-pay

## 2023-01-09 ENCOUNTER — Other Ambulatory Visit (HOSPITAL_BASED_OUTPATIENT_CLINIC_OR_DEPARTMENT_OTHER): Payer: Self-pay

## 2023-01-10 ENCOUNTER — Other Ambulatory Visit (HOSPITAL_BASED_OUTPATIENT_CLINIC_OR_DEPARTMENT_OTHER): Payer: Self-pay

## 2023-01-13 ENCOUNTER — Other Ambulatory Visit (HOSPITAL_BASED_OUTPATIENT_CLINIC_OR_DEPARTMENT_OTHER): Payer: Self-pay

## 2023-01-27 ENCOUNTER — Other Ambulatory Visit (HOSPITAL_BASED_OUTPATIENT_CLINIC_OR_DEPARTMENT_OTHER): Payer: Self-pay

## 2023-01-27 ENCOUNTER — Other Ambulatory Visit: Payer: Self-pay

## 2023-01-27 MED ORDER — PHENTERMINE HCL 30 MG PO CAPS
30.0000 mg | ORAL_CAPSULE | Freq: Every day | ORAL | 0 refills | Status: DC
  Filled 2023-01-27: qty 30, 30d supply, fill #0

## 2023-02-04 ENCOUNTER — Other Ambulatory Visit (HOSPITAL_BASED_OUTPATIENT_CLINIC_OR_DEPARTMENT_OTHER): Payer: Self-pay

## 2023-02-05 ENCOUNTER — Other Ambulatory Visit (HOSPITAL_BASED_OUTPATIENT_CLINIC_OR_DEPARTMENT_OTHER): Payer: Self-pay

## 2023-05-03 ENCOUNTER — Other Ambulatory Visit: Payer: Self-pay

## 2023-05-03 ENCOUNTER — Ambulatory Visit
Admission: EM | Admit: 2023-05-03 | Discharge: 2023-05-03 | Disposition: A | Discharge status: Home / Self Care | Attending: Family Medicine | Admitting: Family Medicine

## 2023-05-03 DIAGNOSIS — J039 Acute tonsillitis, unspecified: Secondary | ICD-10-CM

## 2023-05-03 DIAGNOSIS — K122 Cellulitis and abscess of mouth: Secondary | ICD-10-CM | POA: Diagnosis not present

## 2023-05-03 LAB — POCT INFLUENZA A/B
Influenza A, POC: NEGATIVE
Influenza B, POC: NEGATIVE

## 2023-05-03 LAB — POC SARS CORONAVIRUS 2 AG -  ED: SARS Coronavirus 2 Ag: NEGATIVE

## 2023-05-03 MED ORDER — AMOXICILLIN-POT CLAVULANATE 875-125 MG PO TABS: 1.0000 | ORAL_TABLET | Freq: Two times a day (BID) | ORAL | 0 refills | Status: AC

## 2023-05-03 MED ORDER — PREDNISONE 20 MG PO TABS: ORAL_TABLET | ORAL | 0 refills | Status: DC

## 2023-05-03 NOTE — Discharge Instructions (Addendum)
 Advised patient to take medications as directed with food to completion.  Advised patient to take prednisone with first dose of Augmentin for the next 5 of 7 days.  Encouraged to increase daily water intake to 64 ounces per day while taking this medication.  Advised if symptoms worsen and/or unresolved please follow-up with your PCP or here for further evaluation.

## 2023-05-03 NOTE — ED Provider Notes (Signed)
 [facility]    CSN: [id number] Arrival date & time: 05/03/23  0846      History   Chief Complaint Chief Complaint  Patient presents with   Cough   Fever    HPI Mary Costa is a 28 y.o. female.   HPI 28 year old female presents with cough, sore throat and fever for 6 days.  Reports fever of 101.4 this morning at 4 AM.  Patient currently at 98.2.  PMH significant for obesity.  Past Medical History:  Diagnosis Date   Medical history non-contributory     Patient Active Problem List   Diagnosis Date Noted   S/P cesarean section 01/09/2017   Indication for care in labor or delivery 01/07/2017    Past Surgical History:  Procedure Laterality Date   CESAREAN SECTION N/A 01/09/2017   Procedure: CESAREAN SECTION;  Surgeon: Mitchel Honour, DO;  Location: [facility];  Service: Obstetrics;  Laterality: N/A;    OB History     Gravida  1   Para  1   Term  1   Preterm      AB      Living  1      SAB      IAB      Ectopic      Multiple      Live Births  1            Home Medications    Prior to Admission medications   Medication Sig Start Date End Date Taking? Authorizing Provider  amoxicillin-clavulanate (AUGMENTIN) 875-125 MG tablet Take 1 tablet by mouth 2 (two) times daily for 7 days. 05/03/23 05/10/23 Yes Trevor Iha, FNP  predniSONE (DELTASONE) 20 MG tablet Take 3 tabs PO daily x 5 days. 05/03/23  Yes Trevor Iha, FNP  acetaminophen (TYLENOL) 325 MG tablet Take 2 tablets (650 mg total) every 6 (six) hours as needed by mouth (for pain scale < 4). 01/11/17   Harold Hedge, MD  ferrous sulfate (FERROUSUL) 325 (65 FE) MG tablet Take 1 tablet (325 mg total) 2 (two) times daily with a meal by mouth. 01/11/17   Harold Hedge, MD  ibuprofen (ADVIL,MOTRIN) 600 MG tablet Take 1 tablet (600 mg total) every 6 (six) hours as needed by mouth. 01/11/17   Harold Hedge, MD  oxyCODONE (ROXICODONE) 5 MG immediate release tablet Take 1  tablet (5 mg total) every 6 (six) hours as needed by mouth for severe pain. 01/11/17   Harold Hedge, MD  phentermine 30 MG capsule Take 30 mg by mouth every morning. 03/08/22   [provider]  phentermine 30 MG capsule Take 1 capsule (30 mg total) by mouth daily. Take 2 hours after breakfast 01/26/23     Prenatal Vit-Fe Fumarate-FA (PRENATAL MULTIVITAMIN) TABS tablet Take 1 tablet daily at 12 noon by mouth.    [provider]    Family History History reviewed. No pertinent family history.  Social History Social History   Tobacco Use   Smoking status: Never   Smokeless tobacco: Never  Substance Use Topics   Alcohol use: No   Drug use: No     Allergies   Patient has no known allergies.   Review of Systems Review of Systems   Physical Exam Triage Vital Signs ED Triage Vitals [05/03/23 0933]  Encounter Vitals Group     BP (!) 141/89     Systolic BP Percentile      Diastolic BP Percentile      Pulse  Rate 96     Resp 17     Temp 98.2 F (36.8 C)     Temp Source Oral     SpO2 97 %     Weight      Height      Head Circumference      Peak Flow      Pain Score      Pain Loc      Pain Education      Exclude from Growth Chart    No data found.  Updated Vital Signs BP (!) 141/89 (BP Location: Right Arm)   Pulse 96   Temp 98.2 F (36.8 C) (Oral)   Resp 17   SpO2 97%   Breastfeeding No    Physical Exam Vitals and nursing note reviewed.  Constitutional:      Appearance: Normal appearance. She is obese.  HENT:     Head: Normocephalic and atraumatic.     Right Ear: Tympanic membrane, ear canal and external ear normal.     Left Ear: Tympanic membrane, ear canal and external ear normal.     Nose: Nose normal.     Mouth/Throat:     Mouth: Mucous membranes are dry.     Pharynx: Oropharynx is clear. Uvula midline. Posterior oropharyngeal erythema and uvula swelling present.     Tonsils: 3+ on the right. 3+ on the left.  Eyes:     Extraocular  Movements: Extraocular movements intact.     Conjunctiva/sclera: Conjunctivae normal.     Pupils: Pupils are equal, round, and reactive to light.  Cardiovascular:     Rate and Rhythm: Normal rate and regular rhythm.     Pulses: Normal pulses.     Heart sounds: Normal heart sounds.  Pulmonary:     Effort: Pulmonary effort is normal.     Breath sounds: Normal breath sounds. No wheezing, rhonchi or rales.  Musculoskeletal:        General: Normal range of motion.     Cervical back: Normal range of motion and neck supple.  Skin:    General: Skin is warm and dry.  Neurological:     General: No focal deficit present.     Mental Status: She is alert and oriented to person, place, and time. Mental status is at baseline.  Psychiatric:        Mood and Affect: Mood normal.        Behavior: Behavior normal.      UC Treatments / Results  Labs (all labs ordered are listed, but only abnormal results are displayed) Labs Reviewed  POC SARS CORONAVIRUS 2 AG -  ED  POCT INFLUENZA A/B    EKG   Radiology No results found.  Procedures Procedures (including critical care time)  Medications Ordered in UC Medications - No data to display  Initial Impression / Assessment and Plan / UC Course  I have reviewed the triage vital signs and the nursing notes.  Pertinent labs & imaging results that were available during my care of the patient were reviewed by me and considered in my medical decision making (see chart for details).     MDM: 1.  Uvulitis-Rx'd Augmentin 875/125 mg tablet: Take 1 tablet twice daily x 7 days; 2.  Acute tonsillitis, unspecified etiology same as 1.  Rx'd Augmentin 875/125 mg tablet: Take 1 tablet twice daily x 7 days, Rx'd prednisone 20 mg tablet: Take 3 tabs p.o. daily x 5 days. Advised patient to take medications as directed  with food to completion.  Advised patient to take prednisone with first dose of Augmentin for the next 5 of 7 days.  Encouraged to increase daily  water intake to 64 ounces per day while taking this medication.  Advised if symptoms worsen and/or unresolved please follow-up with your PCP or here for further evaluation.  Patient discharged home, hemodynamically stable. Final Clinical Impressions(s) / UC Diagnoses   Final diagnoses:  Uvulitis  Acute tonsillitis, unspecified etiology     Discharge Instructions      Advised patient to take medications as directed with food to completion.  Advised patient to take prednisone with first dose of Augmentin for the next 5 of 7 days.  Encouraged to increase daily water intake to 64 ounces per day while taking this medication.  Advised if symptoms worsen and/or unresolved please follow-up with your PCP or here for further evaluation.     ED Prescriptions     Medication Sig Dispense Auth. Provider   amoxicillin-clavulanate (AUGMENTIN) 875-125 MG tablet Take 1 tablet by mouth 2 (two) times daily for 7 days. 14 tablet Trevor Iha, FNP   predniSONE (DELTASONE) 20 MG tablet Take 3 tabs PO daily x 5 days. 15 tablet Trevor Iha, FNP      PDMP not reviewed this encounter.   Trevor Iha, FNP 05/03/23 1022

## 2023-05-03 NOTE — ED Triage Notes (Signed)
 Pt c/o Cough, sore throat and fever x 6 days. Fever of 101.4 at 4am. Taking tylenol prn. Neg for covid at home on Mon.

## 2023-10-05 ENCOUNTER — Ambulatory Visit
Admission: EM | Admit: 2023-10-05 | Discharge: 2023-10-05 | Disposition: A | Discharge status: Home / Self Care | Attending: Family Medicine | Admitting: Family Medicine

## 2023-10-05 ENCOUNTER — Encounter: Payer: Self-pay | Admitting: Emergency Medicine

## 2023-10-05 ENCOUNTER — Other Ambulatory Visit: Payer: Self-pay

## 2023-10-05 DIAGNOSIS — R0981 Nasal congestion: Secondary | ICD-10-CM

## 2023-10-05 DIAGNOSIS — J0100 Acute maxillary sinusitis, unspecified: Secondary | ICD-10-CM

## 2023-10-05 MED ORDER — AMOXICILLIN-POT CLAVULANATE 875-125 MG PO TABS: 1.0000 | ORAL_TABLET | Freq: Two times a day (BID) | ORAL | 0 refills | Status: DC

## 2023-10-05 MED ORDER — PREDNISONE 20 MG PO TABS: ORAL_TABLET | ORAL | 0 refills | Status: DC

## 2023-10-05 NOTE — ED Provider Notes (Signed)
 [facility]    CSN: [id number] Arrival date & time: 10/05/23  0902      History   Chief Complaint Chief Complaint  Patient presents with   Cough   Nasal Congestion   Sore Throat    HPI Mary Costa is a 28 y.o. female.   HPI Pleasant 28 year old female presents with sore throat, nasal congestion and dry cough for 1 week.  Reports that her boyfriend may have got her sick.  PMH significant for obesity.  Past Medical History:  Diagnosis Date   Medical history non-contributory     Patient Active Problem List   Diagnosis Date Noted   S/P cesarean section 01/09/2017   Indication for care in labor or delivery 01/07/2017    Past Surgical History:  Procedure Laterality Date   CESAREAN SECTION N/A 01/09/2017   Procedure: CESAREAN SECTION;  Surgeon: Dannielle Bouchard, DO;  Location: [facility];  Service: Obstetrics;  Laterality: N/A;    OB History     Gravida  1   Para  1   Term  1   Preterm      AB      Living  1      SAB      IAB      Ectopic      Multiple      Live Births  1            Home Medications    Prior to Admission medications   Medication Sig Start Date End Date Taking? Authorizing Provider  amoxicillin -clavulanate (AUGMENTIN ) 875-125 MG tablet Take 1 tablet by mouth every 12 (twelve) hours. 10/05/23  Yes Teddy Sharper, FNP  predniSONE  (DELTASONE ) 20 MG tablet Take 3 tabs PO daily x 5 days. 10/05/23  Yes Teddy Sharper, FNP    Family History History reviewed. No pertinent family history.  Social History Social History   Tobacco Use   Smoking status: Never   Smokeless tobacco: Never  Substance Use Topics   Alcohol use: No   Drug use: No     Allergies   Patient has no known allergies.   Review of Systems Review of Systems  HENT:  Positive for congestion, sinus pressure and sinus pain.   All other systems reviewed and are negative.    Physical Exam Triage Vital Signs ED Triage Vitals   Encounter Vitals Group     BP 10/05/23 0934 (!) 148/83     Girls Systolic BP Percentile --      Girls Diastolic BP Percentile --      Boys Systolic BP Percentile --      Boys Diastolic BP Percentile --      Pulse Rate 10/05/23 0934 84     Resp 10/05/23 0934 16     Temp 10/05/23 0934 98.4 F (36.9 C)     Temp Source 10/05/23 0934 Oral     SpO2 10/05/23 0934 98 %     Weight --      Height --      Head Circumference --      Peak Flow --      Pain Score 10/05/23 0932 0     Pain Loc --      Pain Education --      Exclude from Growth Chart --    No data found.  Updated Vital Signs BP (!) 148/83 (BP Location: Right Arm)   Pulse 84   Temp 98.4 F (36.9 C) (Oral)   Resp  16   SpO2 98%    Physical Exam Vitals and nursing note reviewed.  Constitutional:      Appearance: Normal appearance. She is well-developed. She is obese. She is ill-appearing.  HENT:     Head: Normocephalic and atraumatic.     Right Ear: Tympanic membrane and external ear normal.     Left Ear: Tympanic membrane and external ear normal.     Ears:     Comments: Significant eustachian tube dysfunction noted bilaterally    Nose:     Right Sinus: Maxillary sinus tenderness present.     Left Sinus: Maxillary sinus tenderness present.     Mouth/Throat:     Mouth: Mucous membranes are moist.     Pharynx: Oropharynx is clear.  Eyes:     Extraocular Movements: Extraocular movements intact.     Pupils: Pupils are equal, round, and reactive to light.  Cardiovascular:     Rate and Rhythm: Normal rate and regular rhythm.     Pulses: Normal pulses.     Heart sounds: Normal heart sounds.  Pulmonary:     Effort: Pulmonary effort is normal.     Breath sounds: Normal breath sounds. No wheezing, rhonchi or rales.  Musculoskeletal:        General: Normal range of motion.  Skin:    General: Skin is warm and dry.  Neurological:     General: No focal deficit present.     Mental Status: She is alert and oriented to  person, place, and time. Mental status is at baseline.      UC Treatments / Results  Labs (all labs ordered are listed, but only abnormal results are displayed) Labs Reviewed - No data to display  EKG   Radiology No results found.  Procedures Procedures (including critical care time)  Medications Ordered in UC Medications - No data to display  Initial Impression / Assessment and Plan / UC Course  I have reviewed the triage vital signs and the nursing notes.  Pertinent labs & imaging results that were available during my care of the patient were reviewed by me and considered in my medical decision making (see chart for details).     MDM: 1.  Acute maxillary sinusitis, recurrence not specified-Rx'd Augmentin  875/125 mg tablet: Take 1 tablet twice daily x 7 days; 2.  Congestion of nasal sinus-Rx'd prednisone  20 mg tablet: Take 3 tablets p.o. daily x 5 days. Advised patient to take medication as directed with food to completion.  Advised patient to take prednisone  with first dose of Augmentin  for the next 5 of 7 days.  Encouraged to increase daily water intake to 64 ounces per day while taking this medication.  Advised if symptoms worsen and/or unresolved please follow-up with your PCP or here for further evaluation.  Patient discharged home, hemodynamically stable. Final Clinical Impressions(s) / UC Diagnoses   Final diagnoses:  Acute maxillary sinusitis, recurrence not specified  Congestion of nasal sinus     Discharge Instructions      Advised patient to take medication as directed with food to completion.  Advised patient to take prednisone  with first dose of Augmentin  for the next 5 of 7 days.  Encouraged to increase daily water intake to 64 ounces per day while taking this medication.  Advised if symptoms worsen and/or unresolved please follow-up with your PCP or here for further evaluation.     ED Prescriptions     Medication Sig Dispense Auth. Provider    amoxicillin -clavulanate (AUGMENTIN )  875-125 MG tablet Take 1 tablet by mouth every 12 (twelve) hours. 14 tablet Drinda Belgard, FNP   predniSONE  (DELTASONE ) 20 MG tablet Take 3 tabs PO daily x 5 days. 15 tablet Barry Faircloth, FNP      PDMP not reviewed this encounter.   Teddy Sharper, FNP 10/05/23 1030

## 2023-10-05 NOTE — ED Triage Notes (Addendum)
 Patient presents to Urgent Care with complaints of sore throat, nasal congestion, dry cough and postnasal drainage since 1 week ago. Has tried Delsym and Dayquil for symptoms. Denies any fever or chills. Her boyfriend has been sick as well.

## 2023-10-05 NOTE — Discharge Instructions (Addendum)
 Advised patient to take medication as directed with food to completion.  Advised patient to take prednisone  with first dose of Augmentin  for the next 5 of 7 days.  Encouraged to increase daily water intake to 64 ounces per day while taking this medication.  Advised if symptoms worsen and/or unresolved please follow-up with your PCP or here for further evaluation.

## 2023-11-30 ENCOUNTER — Ambulatory Visit
Admission: EM | Admit: 2023-11-30 | Discharge: 2023-11-30 | Disposition: A | Discharge status: Home / Self Care | Attending: Family Medicine | Admitting: Family Medicine

## 2023-11-30 ENCOUNTER — Other Ambulatory Visit: Payer: Self-pay

## 2023-11-30 DIAGNOSIS — G4486 Cervicogenic headache: Secondary | ICD-10-CM | POA: Insufficient documentation

## 2023-11-30 DIAGNOSIS — J029 Acute pharyngitis, unspecified: Secondary | ICD-10-CM | POA: Diagnosis present

## 2023-11-30 DIAGNOSIS — J039 Acute tonsillitis, unspecified: Secondary | ICD-10-CM | POA: Diagnosis present

## 2023-11-30 LAB — POCT RAPID STREP A (OFFICE): Rapid Strep A Screen: NEGATIVE

## 2023-11-30 MED ORDER — AMOXICILLIN-POT CLAVULANATE 875-125 MG PO TABS: 1.0000 | ORAL_TABLET | Freq: Two times a day (BID) | ORAL | 0 refills | Status: AC

## 2023-11-30 MED ORDER — PREDNISONE 20 MG PO TABS: ORAL_TABLET | ORAL | 0 refills | Status: AC

## 2023-11-30 MED ORDER — METHYLPREDNISOLONE SODIUM SUCC 125 MG IJ SOLR
125.0000 mg | Freq: Once | INTRAMUSCULAR | Status: AC
  Administered 2023-11-30: 125 mg via INTRAMUSCULAR

## 2023-11-30 NOTE — Discharge Instructions (Addendum)
 Advised patient to take medications as directed with food to completion.  Advised patient to take prednisone  with first dose of Augmentin  for the next 5 of 7 days.  Advised patient may take OTC extra strength Excedrin 1-2 tabs daily for cervicogenic headache.  Encouraged increase daily water intake to 64 ounces per day while taking these medications.  Advised if symptoms worsen and/or unresolved please follow-up with PCP or here for further evaluation.

## 2023-11-30 NOTE — ED Triage Notes (Signed)
 Pt presents to uc with sore throat, difficulty turning neck, headache for 3 days. Has been using tyelnol otc.

## 2023-11-30 NOTE — ED Provider Notes (Addendum)
 [facility]    CSN: [id number] Arrival date & time: 11/30/23  0916      History   Chief Complaint Chief Complaint  Patient presents with   Sore Throat    HPI Mary Costa is a 28 y.o. female.   HPI 28 year old female presents with sore throat and difficulty turning neck with headache for 3 days.  Patient has been using Tylenol .  PMH significant for obesity.  Past Medical History:  Diagnosis Date   Medical history non-contributory     Patient Active Problem List   Diagnosis Date Noted   S/P cesarean section 01/09/2017   Indication for care in labor or delivery 01/07/2017    Past Surgical History:  Procedure Laterality Date   CESAREAN SECTION N/A 01/09/2017   Procedure: CESAREAN SECTION;  Surgeon: Dannielle Bouchard, DO;  Location: [facility];  Service: Obstetrics;  Laterality: N/A;    OB History     Gravida  1   Para  1   Term  1   Preterm      AB      Living  1      SAB      IAB      Ectopic      Multiple      Live Births  1            Home Medications    Prior to Admission medications   Medication Sig Start Date End Date Taking? Authorizing Provider  amoxicillin -clavulanate (AUGMENTIN ) 875-125 MG tablet Take 1 tablet by mouth every 12 (twelve) hours. 11/30/23  Yes Teddy Sharper, FNP  predniSONE  (DELTASONE ) 20 MG tablet Take 3 tabs PO daily x 5 days. 11/30/23  Yes Teddy Sharper, FNP    Family History History reviewed. No pertinent family history.  Social History Social History   Tobacco Use   Smoking status: Never   Smokeless tobacco: Never  Substance Use Topics   Alcohol use: No   Drug use: No     Allergies   Patient has no known allergies.   Review of Systems Review of Systems  HENT:  Positive for sore throat.   All other systems reviewed and are negative.    Physical Exam Triage Vital Signs ED Triage Vitals  Encounter Vitals Group     BP      Girls Systolic BP Percentile      Girls  Diastolic BP Percentile      Boys Systolic BP Percentile      Boys Diastolic BP Percentile      Pulse      Resp      Temp      Temp src      SpO2      Weight      Height      Head Circumference      Peak Flow      Pain Score      Pain Loc      Pain Education      Exclude from Growth Chart    No data found.  Updated Vital Signs BP 137/84   Pulse 77   Temp 98.3 F (36.8 C)   Resp 19   SpO2 98%   Visual Acuity Right Eye Distance:   Left Eye Distance:   Bilateral Distance:    Right Eye Near:   Left Eye Near:    Bilateral Near:     Physical Exam Vitals and nursing note reviewed.  Constitutional:  Appearance: Normal appearance. She is obese.  HENT:     Head: Normocephalic and atraumatic.     Right Ear: Tympanic membrane, ear canal and external ear normal.     Left Ear: Tympanic membrane, ear canal and external ear normal.     Mouth/Throat:     Mouth: Mucous membranes are moist.     Pharynx: Oropharynx is clear. Uvula midline. Uvula swelling present.     Tonsils: Tonsillar exudate present. 4+ on the right. 4+ on the left.  Eyes:     Extraocular Movements: Extraocular movements intact.     Conjunctiva/sclera: Conjunctivae normal.     Pupils: Pupils are equal, round, and reactive to light.  Cardiovascular:     Rate and Rhythm: Normal rate and regular rhythm.     Pulses: Normal pulses.     Heart sounds: Normal heart sounds.  Pulmonary:     Effort: Pulmonary effort is normal.     Breath sounds: Normal breath sounds. No wheezing, rhonchi or rales.  Musculoskeletal:        General: Normal range of motion.  Skin:    General: Skin is warm and dry.  Neurological:     General: No focal deficit present.     Mental Status: She is alert and oriented to person, place, and time. Mental status is at baseline.  Psychiatric:        Mood and Affect: Mood normal.        Behavior: Behavior normal.      UC Treatments / Results  Labs (all labs ordered are listed, but  only abnormal results are displayed) Labs Reviewed  POCT RAPID STREP A (OFFICE) - Normal    EKG   Radiology No results found.  Procedures Procedures (including critical care time)  Medications Ordered in UC Medications  methylPREDNISolone sodium succinate (SOLU-MEDROL) 125 mg/2 mL injection 125 mg (125 mg Intramuscular Given 11/30/23 0957)    Initial Impression / Assessment and Plan / UC Course  I have reviewed the triage vital signs and the nursing notes.  Pertinent labs & imaging results that were available during my care of the patient were reviewed by me and considered in my medical decision making (see chart for details).     MDM: 1.  Acute tonsillitis, unspecified etiology-Rx'd Augmentin  875/125 mg tablet, take 1 tablet twice daily x 7 days; 2.  Sore throat-IM Solu-Medrol 125 mg given once in clinic; 3.  Cervicogenic headache-Rx'd prednisone  20 mg tablet: Take 3 tablets p.o. daily x 5 days. Advised patient to take medications as directed with food to completion.  Advised patient to take prednisone  with first dose of Augmentin  for the next 5 of 7 days.  Advised patient may take OTC extra strength Excedrin 1-2 tabs daily for cervicogenic headache.  Encouraged increase daily water intake to 64 ounces per day while taking these medications.  Advised if symptoms worsen and/or unresolved please follow-up with PCP or here for further evaluation.  Patient discharged home, hemodynamically stable. Final Clinical Impressions(s) / UC Diagnoses   Final diagnoses:  Sore throat  Cervicogenic headache  Acute tonsillitis, unspecified etiology     Discharge Instructions      Advised patient to take medications as directed with food to completion.  Advised patient to take prednisone  with first dose of Augmentin  for the next 5 of 7 days.  Advised patient may take OTC extra strength Excedrin 1-2 tabs daily for cervicogenic headache.  Encouraged increase daily water intake to 64 ounces per day  while taking these medications.  Advised if symptoms worsen and/or unresolved please follow-up with PCP or here for further evaluation.     ED Prescriptions     Medication Sig Dispense Auth. Provider   amoxicillin -clavulanate (AUGMENTIN ) 875-125 MG tablet Take 1 tablet by mouth every 12 (twelve) hours. 14 tablet Yoshika Vensel, FNP   predniSONE  (DELTASONE ) 20 MG tablet Take 3 tabs PO daily x 5 days. 15 tablet Cardelia Sassano, FNP      PDMP not reviewed this encounter.   Teddy Sharper, FNP 11/30/23 1010    Teddy Sharper, FNP 11/30/23 1019

## 2023-12-03 LAB — CULTURE, GROUP A STREP (THRC)
